# Patient Record
Sex: Male | Born: 1950 | Race: White | Hispanic: No | Marital: Married | Smoking: Never smoker
Health system: Southern US, Community
[De-identification: ages and names within clinical notes are randomized; demographics above are authoritative.]

## PROBLEM LIST (undated history)

## (undated) DIAGNOSIS — N189 Chronic kidney disease, unspecified: Secondary | ICD-10-CM

## (undated) HISTORY — PX: OTHER SURGICAL HISTORY: SHX169

---

## 1999-08-28 ENCOUNTER — Other Ambulatory Visit: Admission: RE | Admit: 1999-08-28 | Discharge: 1999-08-28 | Payer: Self-pay | Admitting: Urology

## 2011-07-01 ENCOUNTER — Other Ambulatory Visit: Payer: Self-pay | Admitting: Urology

## 2011-07-01 ENCOUNTER — Ambulatory Visit (HOSPITAL_BASED_OUTPATIENT_CLINIC_OR_DEPARTMENT_OTHER)
Admission: RE | Admit: 2011-07-01 | Discharge: 2011-07-01 | Disposition: A | Discharge status: Home / Self Care | Payer: BC Managed Care – PPO | Source: Ambulatory Visit | Attending: Urology | Admitting: Urology

## 2011-07-01 DIAGNOSIS — N323 Diverticulum of bladder: Secondary | ICD-10-CM | POA: Insufficient documentation

## 2011-07-01 DIAGNOSIS — Z01812 Encounter for preprocedural laboratory examination: Secondary | ICD-10-CM | POA: Insufficient documentation

## 2011-07-01 DIAGNOSIS — R310 Gross hematuria: Secondary | ICD-10-CM | POA: Insufficient documentation

## 2011-07-07 NOTE — Op Note (Signed)
NAME:  Jacob Dunlap, Jacob Dunlap NO.:  [id number]  MEDICAL RECORD NO.:  [id number]  LOCATION:                                 FACILITY:  PHYSICIAN:  Bertram Millard. Aariv Medlock, M.D.DATE OF BIRTH:  06/26/1951  DATE OF PROCEDURE:  07/01/2011 DATE OF DISCHARGE:                              OPERATIVE REPORT   PREOPERATIVE DIAGNOSIS:  History of bladder diverticulum with recent gross hematuria.  POSTOPERATIVE DIAGNOSIS:  History of bladder diverticulum with recent gross hematuria.  PRINCIPAL PROCEDURE:  Cystoscopy, bladder barbotage, bladder biopsy.  SURGEON:  Bertram Millard. Dereke Neumann, M.D.  ANESTHESIA:  General with LMA.  COMPLICATIONS:  None.  BRIEF HISTORY:  Jacob Dunlap is a 60 year old male, who I had been seeing for approximately 10 to 12 years.  The patient has a bladder diverticulum. He has intermittent gross hematuria.  Biopsies of a little inflammatory area in the diverticulum has revealed only atypia with no evidence of neoplasia.  He recently presented with gross hematuria and urinary retention.  A Foley catheter was placed.  He has had intermittent gross hematuria despite this catheterization.  As the patient has had this recent hematuria, a bladder diverticulum and history of atypia, he presents at this time for anesthetic cystoscopy, bladder biopsy.  Risks and complications of procedure have been discussed with the patient.  He understands these and desires to proceed.  DESCRIPTION OF PROCEDURE:  Jacob Dunlap was identified in the holding area. He received preoperative IV antibiotics.  He was taken to the operating room where general anesthetic was administered using the LMA.  He is placed in the dorsal lithotomy position.  Genitalia and perineum were prepped and draped.  Time-out was performed. The procedure then commenced.  A 22-French panendoscope was passed under direct vision through his urethra.  Urethra was normal, there was no significant prostatic obstruction.   Bladder was then entered.  No specific raised lesions were seen.  There were scattered erythematous areas throughout the bladder, circumferentially.  This looked almost like findings consistent with acute cystitis.  Three of these areas were biopsied with the cold cup biopsy forceps.  The biopsy sites were then cauterized with the Bugbee electrode.  There was only 1 diverticulum noted.  This was posterior, just about in the midline, superior to the trigone.  It had a very wide diverticular neck.  The diverticulum was entered.  It was very capacious, approximately the same volume is the bladder.  There were several "years" of the diverticulum.  There was only one area of abnormality that I could see, which was approximately 1 cm x 2 cm in size.  There were some mildly erythematous tissue.  It did not look to me like carcinoma in situ.  There were several small clots that we irrigated out of the diverticulum.  Once these clots were irrigated, I did not see any other lesions.  The small erythematous area was biopsied x2 with a cold cup biopsy forceps, and the biopsy site was then coagulated.  I obtained washings from both the diverticulum in the bladder through the cystoscope, these were sent for urine cytology.  Following the cytologies being sent off, the biopsied sites were inspected.  No bleeding was seen.  At this point, the bladder was drained and a 20-French Foley catheter was placed and hooked to dependent drainage.  The patient tolerated the procedure well.  He was awakened and taken to PACU in stable condition. It should be noted that 3 specimens were sent - 1 specimen from the bladder biopsy, 1 specimen from the diverticular biopsy, and 1 bladder washing.     Bertram Millard. Retta Diones, M.D.     SMD/MEDQ  D:  07/02/2011  T:  07/02/2011  Job:  [id number]  Electronically Signed by Marcine Matar M.D. on 07/07/2011 01:12:12 PM

## 2011-08-20 ENCOUNTER — Other Ambulatory Visit: Payer: Self-pay | Admitting: Urology

## 2011-09-10 ENCOUNTER — Encounter (HOSPITAL_COMMUNITY): Payer: Self-pay | Admitting: Pharmacy Technician

## 2011-09-18 ENCOUNTER — Encounter (HOSPITAL_COMMUNITY): Payer: Self-pay

## 2011-09-18 ENCOUNTER — Other Ambulatory Visit: Payer: Self-pay

## 2011-09-18 ENCOUNTER — Encounter (HOSPITAL_COMMUNITY)
Admission: RE | Admit: 2011-09-18 | Discharge: 2011-09-18 | Disposition: A | Discharge status: Home / Self Care | Payer: BC Managed Care – PPO | Source: Ambulatory Visit | Attending: Urology | Admitting: Urology

## 2011-09-18 ENCOUNTER — Ambulatory Visit (HOSPITAL_COMMUNITY)
Admission: RE | Admit: 2011-09-18 | Discharge: 2011-09-18 | Disposition: A | Discharge status: Home / Self Care | Payer: BC Managed Care – PPO | Source: Ambulatory Visit | Attending: Urology | Admitting: Urology

## 2011-09-18 DIAGNOSIS — Z01812 Encounter for preprocedural laboratory examination: Secondary | ICD-10-CM | POA: Insufficient documentation

## 2011-09-18 DIAGNOSIS — Z01818 Encounter for other preprocedural examination: Secondary | ICD-10-CM | POA: Insufficient documentation

## 2011-09-18 DIAGNOSIS — Z01810 Encounter for preprocedural cardiovascular examination: Secondary | ICD-10-CM | POA: Insufficient documentation

## 2011-09-18 HISTORY — DX: Chronic kidney disease, unspecified: N18.9

## 2011-09-18 LAB — CBC
MCH: 24.8 pg — ABNORMAL LOW (ref 26.0–34.0)
MCHC: 30.4 g/dL (ref 30.0–36.0)
MCV: 81.8 fL (ref 78.0–100.0)
Platelets: 241 10*3/uL (ref 150–400)
RBC: 4.55 MIL/uL (ref 4.22–5.81)

## 2011-09-18 LAB — BASIC METABOLIC PANEL
BUN: 22 mg/dL (ref 6–23)
CO2: 27 mEq/L (ref 19–32)
Calcium: 9.1 mg/dL (ref 8.4–10.5)
GFR calc non Af Amer: >90 mL/min (ref >90)
Glucose, Bld: 92 mg/dL (ref 70–99)

## 2011-09-18 LAB — SURGICAL PCR SCREEN: MRSA, PCR: NEGATIVE

## 2011-09-18 NOTE — Patient Instructions (Signed)
[address] LENIS NETTLETON  09/18/2011   Your procedure is scheduled on:  09/25/11 [id number] am   Report to [facility] at 0515 AM.  Call this number if you have problems the morning of surgery: [phone]   Remember:   Do not eat food:After Midnight.  May have clear liquids:until Midnight .  Clear liquids include soda, tea, black coffee, apple or grape juice, broth.  Take these medicines the morning of surgery with A SIP OF WATER:    Do not wear jewelry.  Do not wear lotions, powders, or perfumes. .  Do not shave 48 hours prior to surgery.Do not bring valuables to the hospital.  Contacts, dentures or bridgework may not be worn into surgery.  Leave suitcase in the car. After surgery it may be brought to your room.  For patients admitted to the hospital, checkout time is 11:00 AM the day of discharge.     Special Instructions: CHG Shower Use Special Wash: 1/2 bottle night before surgery and 1/2 bottle morning of surgery. shower chin to toes with CHG.  Wash face and private parts with regular soap.     Please read over the following fact sheets that you were given: MRSA Information, coughing and deep breathing exercises and leg exercises.

## 2011-09-24 NOTE — H&P (Signed)
  Chief Complaint  Bladder diverticulum   History of Present Illness  Jacob Dunlap is a 61 year old patient who has been followed by Dr. Retta Diones since 1996.  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 He most recently presented with gross hematuria in October and underwent cystoscopy with biopsies of his bladder and diverticulum which were negative for malignancy. Bladder washings for cytology were also benign and a hematuria protocol CT scan did not indicate any concerning upper tract source. Due to his persistent problems with chronic urinary tract infections and gross hematuria, he is seen at the request of Dr. Retta Diones to consider a robotic bladder diverticulectomy. His baseline renal function is normal with a creatinine of 0.95.  He was recently treated for a citrobacter UTI with nitrofurantoin. He currently has a symptomatically without hematuria or dysuria. However, he does continue to have cloudy urine.   Past Medical History Problems  1. History of  No Medical Problems  Surgical History Problems  1. History of  Cystoscopy With Biopsy  Current Meds 1. Multi-Day TABS; Therapy: (Recorded:26Mar2012) to  Allergies Medication  1. No Known Drug Allergies  Family History Problems  1. Family history of  Family Health Status Number Of Children 2-sons 2. Paternal history of  Reported Previous Cardiac Problems  Social History Problems  1. Paternal history of  Death In The Family Father age 37 Heart trouble 2. Marital History - Currently Married 3. Never A Smoker 4. 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 Alcohol Use 6. Tobacco Use  Review of Systems Constitutional, skin, eye, otolaryngeal, hematologic/lymphatic, cardiovascular, pulmonary, endocrine, musculoskeletal, gastrointestinal, neurological and psychiatric system(s) were reviewed and pertinent findings if present are noted.    Vitals  BMI Calculated: 23.97 BSA Calculated: 1.87 Height: 5 ft 8.5 in Weight: 160 lb    Physical Exam Constitutional: Well nourished and well developed . No acute distress.  ENT:. The ears and nose are normal in appearance.  Neck: The appearance of the neck is normal and no neck mass is present.  Pulmonary: No respiratory distress and normal respiratory rhythm and effort.  Cardiovascular: Heart rate and rhythm are normal . No peripheral edema.  Abdomen: The abdomen is soft and nontender. No masses are palpated. No CVA tenderness. No hernias are palpable. No hepatosplenomegaly noted.  Skin: Normal skin turgor, no visible rash and no visible skin lesions.  Neuro/Psych:. Mood and affect are appropriate.        Assessment Assessed  1. Diverticulum Of The Bladder 596.3  Plan  1. Bladder diverticulum: He will undergo cystoscopy and stent placement followed by a robotic assisted laparoscopic bladder diverticulectomy.

## 2011-09-25 ENCOUNTER — Encounter (HOSPITAL_COMMUNITY): Payer: Self-pay | Admitting: *Deleted

## 2011-09-25 ENCOUNTER — Other Ambulatory Visit: Payer: Self-pay | Admitting: Urology

## 2011-09-25 ENCOUNTER — Encounter (HOSPITAL_COMMUNITY): Payer: Self-pay | Admitting: Registered Nurse

## 2011-09-25 ENCOUNTER — Ambulatory Visit (HOSPITAL_COMMUNITY): Payer: BC Managed Care – PPO | Admitting: Registered Nurse

## 2011-09-25 ENCOUNTER — Ambulatory Visit (HOSPITAL_COMMUNITY): Payer: BC Managed Care – PPO

## 2011-09-25 ENCOUNTER — Encounter (HOSPITAL_COMMUNITY)
Admission: RE | Disposition: A | Discharge status: Home / Self Care | Payer: Self-pay | Source: Ambulatory Visit | Attending: Urology

## 2011-09-25 ENCOUNTER — Observation Stay (HOSPITAL_COMMUNITY)
Admission: RE | Admit: 2011-09-25 | Discharge: 2011-09-26 | DRG: 311 | Disposition: A | Discharge status: Home / Self Care | Payer: BC Managed Care – PPO | Source: Ambulatory Visit | Attending: Urology | Admitting: Urology

## 2011-09-25 DIAGNOSIS — Z87440 Personal history of urinary (tract) infections: Secondary | ICD-10-CM

## 2011-09-25 DIAGNOSIS — N323 Diverticulum of bladder: Principal | ICD-10-CM | POA: Diagnosis present

## 2011-09-25 LAB — BASIC METABOLIC PANEL
BUN: 12 mg/dL (ref 6–23)
GFR calc Af Amer: >90 mL/min (ref >90)
GFR calc non Af Amer: 88 mL/min — ABNORMAL LOW (ref >90)
Potassium: 3.6 mEq/L (ref 3.5–5.1)
Sodium: 136 mEq/L (ref 135–145)

## 2011-09-25 LAB — HEMOGLOBIN AND HEMATOCRIT, BLOOD: Hemoglobin: 10.1 g/dL — ABNORMAL LOW (ref 13.0–17.0)

## 2011-09-25 LAB — TYPE AND SCREEN
ABO/RH(D): A POS
Antibody Screen: NEGATIVE

## 2011-09-25 LAB — ABO/RH: ABO/RH(D): A POS

## 2011-09-25 SURGERY — CYSTOSCOPY, WITH STENT INSERTION
Anesthesia: General | Site: Bladder | Wound class: Clean Contaminated

## 2011-09-25 MED ORDER — ZOLPIDEM TARTRATE 5 MG PO TABS: 5.0000 mg | ORAL_TABLET | Freq: Every evening | ORAL | Status: DC | PRN

## 2011-09-25 MED ORDER — SODIUM CHLORIDE 0.9 % IV SOLN
1.5000 g | INTRAVENOUS | Status: AC
  Administered 2011-09-25: 1.5 g via INTRAVENOUS
  Filled 2011-09-25: qty 1.5

## 2011-09-25 MED ORDER — FENTANYL CITRATE 0.05 MG/ML IJ SOLN
INTRAMUSCULAR | Status: DC | PRN
  Administered 2011-09-25 (×5): 50 ug via INTRAVENOUS

## 2011-09-25 MED ORDER — PROPOFOL 10 MG/ML IV EMUL
INTRAVENOUS | Status: DC | PRN
  Administered 2011-09-25: 150 mg via INTRAVENOUS

## 2011-09-25 MED ORDER — DIPHENHYDRAMINE HCL 12.5 MG/5ML PO ELIX: 12.5000 mg | ORAL_SOLUTION | Freq: Four times a day (QID) | ORAL | Status: DC | PRN

## 2011-09-25 MED ORDER — LIDOCAINE HCL (CARDIAC) 20 MG/ML IV SOLN
INTRAVENOUS | Status: DC | PRN
  Administered 2011-09-25: 80 mg via INTRAVENOUS

## 2011-09-25 MED ORDER — CIPROFLOXACIN IN D5W 400 MG/200ML IV SOLN
400.0000 mg | Freq: Two times a day (BID) | INTRAVENOUS | Status: AC
  Administered 2011-09-25 – 2011-09-26 (×2): 400 mg via INTRAVENOUS
  Filled 2011-09-25 (×3): qty 200

## 2011-09-25 MED ORDER — MORPHINE SULFATE 2 MG/ML IJ SOLN
2.0000 mg | INTRAMUSCULAR | Status: DC | PRN
  Administered 2011-09-25: 2 mg via INTRAVENOUS
  Filled 2011-09-25: qty 1

## 2011-09-25 MED ORDER — PROMETHAZINE HCL 25 MG/ML IJ SOLN: 6.2500 mg | INTRAMUSCULAR | Status: DC | PRN

## 2011-09-25 MED ORDER — HYDROMORPHONE HCL PF 1 MG/ML IJ SOLN
INTRAMUSCULAR | Status: DC | PRN
  Administered 2011-09-25 (×2): 0.5 mg via INTRAVENOUS

## 2011-09-25 MED ORDER — BUPIVACAINE-EPINEPHRINE 0.25% -1:200000 IJ SOLN
INTRAMUSCULAR | Status: DC | PRN
  Administered 2011-09-25: 20 mL

## 2011-09-25 MED ORDER — ONDANSETRON HCL 4 MG/2ML IJ SOLN
INTRAMUSCULAR | Status: DC | PRN
  Administered 2011-09-25: 4 mg via INTRAVENOUS

## 2011-09-25 MED ORDER — LACTATED RINGERS IV SOLN
INTRAVENOUS | Status: DC | PRN
  Administered 2011-09-25: 07:00:00 via INTRAVENOUS

## 2011-09-25 MED ORDER — ACETAMINOPHEN 325 MG PO TABS: 650.0000 mg | ORAL_TABLET | ORAL | Status: DC | PRN

## 2011-09-25 MED ORDER — NEOSTIGMINE METHYLSULFATE 1 MG/ML IJ SOLN
INTRAMUSCULAR | Status: DC | PRN
  Administered 2011-09-25: 4 mg via INTRAVENOUS

## 2011-09-25 MED ORDER — CIPROFLOXACIN IN D5W 400 MG/200ML IV SOLN
400.0000 mg | INTRAVENOUS | Status: AC
  Administered 2011-09-25: 400 mg via INTRAVENOUS

## 2011-09-25 MED ORDER — KCL IN DEXTROSE-NACL 20-5-0.45 MEQ/L-%-% IV SOLN
INTRAVENOUS | Status: DC
  Administered 2011-09-25 – 2011-09-26 (×3): via INTRAVENOUS
  Filled 2011-09-25 (×5): qty 1000

## 2011-09-25 MED ORDER — CIPROFLOXACIN HCL 500 MG PO TABS: 500.0000 mg | ORAL_TABLET | Freq: Two times a day (BID) | ORAL | Status: AC

## 2011-09-25 MED ORDER — SODIUM CHLORIDE 0.9 % IR SOLN
Status: DC | PRN
  Administered 2011-09-25: 1000 mL

## 2011-09-25 MED ORDER — CEFAZOLIN SODIUM 1-5 GM-% IV SOLN
1.0000 g | Freq: Three times a day (TID) | INTRAVENOUS | Status: AC
  Administered 2011-09-25 – 2011-09-26 (×2): 1 g via INTRAVENOUS
  Filled 2011-09-25 (×3): qty 50

## 2011-09-25 MED ORDER — SODIUM CHLORIDE 0.9 % IV BOLUS (SEPSIS)
1000.0000 mL | Freq: Once | INTRAVENOUS | Status: AC
  Administered 2011-09-25: 1000 mL via INTRAVENOUS

## 2011-09-25 MED ORDER — LACTATED RINGERS IV SOLN: INTRAVENOUS | Status: DC

## 2011-09-25 MED ORDER — MIDAZOLAM HCL 5 MG/5ML IJ SOLN
INTRAMUSCULAR | Status: DC | PRN
  Administered 2011-09-25: 2 mg via INTRAVENOUS

## 2011-09-25 MED ORDER — HYDROMORPHONE HCL PF 1 MG/ML IJ SOLN
0.2500 mg | INTRAMUSCULAR | Status: DC | PRN
  Administered 2011-09-25 (×2): 0.5 mg via INTRAVENOUS

## 2011-09-25 MED ORDER — STERILE WATER FOR IRRIGATION IR SOLN
Status: DC | PRN
  Administered 2011-09-25: 1500 mL

## 2011-09-25 MED ORDER — ONDANSETRON HCL 4 MG/2ML IJ SOLN: 4.0000 mg | INTRAMUSCULAR | Status: DC | PRN

## 2011-09-25 MED ORDER — DIPHENHYDRAMINE HCL 50 MG/ML IJ SOLN: 12.5000 mg | Freq: Four times a day (QID) | INTRAMUSCULAR | Status: DC | PRN

## 2011-09-25 MED ORDER — LACTATED RINGERS IR SOLN
Status: DC | PRN
  Administered 2011-09-25: 1000 mL

## 2011-09-25 MED ORDER — ACETAMINOPHEN 10 MG/ML IV SOLN
INTRAVENOUS | Status: DC | PRN
  Administered 2011-09-25: 1000 mg via INTRAVENOUS

## 2011-09-25 MED ORDER — DOCUSATE SODIUM 100 MG PO CAPS
100.0000 mg | ORAL_CAPSULE | Freq: Two times a day (BID) | ORAL | Status: DC
  Administered 2011-09-25 – 2011-09-26 (×2): 100 mg via ORAL
  Filled 2011-09-25 (×3): qty 1

## 2011-09-25 MED ORDER — GLYCOPYRROLATE 0.2 MG/ML IJ SOLN
INTRAMUSCULAR | Status: DC | PRN
  Administered 2011-09-25: .6 mg via INTRAVENOUS

## 2011-09-25 MED ORDER — KETOROLAC TROMETHAMINE 15 MG/ML IJ SOLN
15.0000 mg | Freq: Four times a day (QID) | INTRAMUSCULAR | Status: DC
  Administered 2011-09-25 – 2011-09-26 (×5): 15 mg via INTRAVENOUS
  Filled 2011-09-25 (×5): qty 1

## 2011-09-25 MED ORDER — ROCURONIUM BROMIDE 100 MG/10ML IV SOLN
INTRAVENOUS | Status: DC | PRN
  Administered 2011-09-25: 50 mg via INTRAVENOUS
  Administered 2011-09-25: 5 mg via INTRAVENOUS
  Administered 2011-09-25: 20 mg via INTRAVENOUS
  Administered 2011-09-25: 10 mg via INTRAVENOUS

## 2011-09-25 MED ORDER — HYDROCODONE-ACETAMINOPHEN 5-325 MG PO TABS: 1.0000 | ORAL_TABLET | Freq: Four times a day (QID) | ORAL | Status: AC | PRN

## 2011-09-25 SURGICAL SUPPLY — 80 items
ADAPTER CATH URET PLST 4-6FR (CATHETERS) ×3 IMPLANT
ADAPTER GOLDBERG URETERAL (ADAPTER) IMPLANT
APPLICATOR COTTON TIP 6IN STRL (MISCELLANEOUS) IMPLANT
APPLICATOR SURGIFLO ENDO (HEMOSTASIS) IMPLANT
BAG URO CATCHER STRL LF (DRAPE) ×3 IMPLANT
BLADE SURG SZ10 CARB STEEL (BLADE) ×3 IMPLANT
CANISTER SUCTION 2500CC (MISCELLANEOUS) ×3 IMPLANT
CATH INTERMIT  6FR 70CM (CATHETERS) ×3 IMPLANT
CHLORAPREP W/TINT 26ML (MISCELLANEOUS) ×3 IMPLANT
CLIP LIGATING HEM O LOK PURPLE (MISCELLANEOUS) ×6 IMPLANT
CLOTH BEACON ORANGE TIMEOUT ST (SAFETY) ×3 IMPLANT
CORD HIGH FREQUENCY UNIPOLAR (ELECTROSURGICAL) ×3 IMPLANT
CORDS BIPOLAR (ELECTRODE) ×3 IMPLANT
COVER TIP SHEARS 8 DVNC (MISCELLANEOUS) ×2 IMPLANT
COVER TIP SHEARS 8MM DA VINCI (MISCELLANEOUS) ×1
DECANTER SPIKE VIAL GLASS SM (MISCELLANEOUS) ×3 IMPLANT
DERMABOND ADVANCED (GAUZE/BANDAGES/DRESSINGS) ×1
DERMABOND ADVANCED .7 DNX12 (GAUZE/BANDAGES/DRESSINGS) ×2 IMPLANT
DRAIN CHANNEL 15F RND FF 3/16 (WOUND CARE) ×3 IMPLANT
DRAPE CAMERA CLOSED 9X96 (DRAPES) ×3 IMPLANT
DRSG TEGADERM 6X8 (GAUZE/BANDAGES/DRESSINGS) ×9 IMPLANT
ELECT REM PT RETURN 9FT ADLT (ELECTROSURGICAL) ×3
ELECTRODE REM PT RTRN 9FT ADLT (ELECTROSURGICAL) ×2 IMPLANT
GLOVE BIOGEL M STRL SZ7.5 (GLOVE) ×9 IMPLANT
GOWN STRL NON-REIN LRG LVL3 (GOWN DISPOSABLE) ×15 IMPLANT
GUIDEWIRE STR DUAL SENSOR (WIRE) ×3 IMPLANT
HOLDER FOLEY CATH W/STRAP (MISCELLANEOUS) ×3 IMPLANT
MANIFOLD NEPTUNE II (INSTRUMENTS) ×3 IMPLANT
NDL SAFETY ECLIPSE 18X1.5 (NEEDLE) ×2 IMPLANT
NEEDLE HYPO 18GX1.5 SHARP (NEEDLE) ×1
NS IRRIG 1000ML POUR BTL (IV SOLUTION) ×3 IMPLANT
PACK CYSTO (CUSTOM PROCEDURE TRAY) ×3 IMPLANT
PACK ROBOT UROLOGY CUSTOM (CUSTOM PROCEDURE TRAY) ×3 IMPLANT
POSITIONER SURGICAL ARM (MISCELLANEOUS) ×6 IMPLANT
POUCH ENDO CATCH II 15MM (MISCELLANEOUS) IMPLANT
POUCH SPECIMEN RETRIEVAL 10MM (ENDOMECHANICALS) ×3 IMPLANT
RELOAD GOLD (STAPLE) IMPLANT
RELOAD LINEAR CUT PROX 55 BLUE (ENDOMECHANICALS) IMPLANT
RELOAD WHITE ECR60W (STAPLE) IMPLANT
SET TUBE IRRIG SUCTION NO TIP (IRRIGATION / IRRIGATOR) ×3 IMPLANT
SOLUTION ELECTROLUBE (MISCELLANEOUS) ×3 IMPLANT
SPONGE GAUZE 4X4 12PLY (GAUZE/BANDAGES/DRESSINGS) ×3 IMPLANT
SPONGE LAP 18X18 X RAY DECT (DISPOSABLE) IMPLANT
STAPLE ECHEON FLEX 60 POW ENDO (STAPLE) IMPLANT
STAPLER GUN LINEAR PROX 60 (STAPLE) IMPLANT
STAPLER PROXIMATE 55 BLUE (STAPLE) IMPLANT
STENT CONTOUR 7FRX24X.038 (STENTS) ×6 IMPLANT
STENT SINGLE 7F (STENTS) IMPLANT
SURGIFLO W/THROMBIN 8M KIT (HEMOSTASIS) IMPLANT
SUT MNCRL AB 4-0 PS2 18 (SUTURE) ×6 IMPLANT
SUT MON AB 2-0 SH 27 (SUTURE) IMPLANT
SUT MON AB 2-0 SH27 (SUTURE) IMPLANT
SUT PDS AB 1 CTX 36 (SUTURE) IMPLANT
SUT PDS AB 3-0 SH 27 (SUTURE) IMPLANT
SUT PDS AB 4-0 RB1 27 (SUTURE) IMPLANT
SUT PDS AB 4-0 SH 27 (SUTURE) ×3 IMPLANT
SUT SILK 0 (SUTURE)
SUT SILK 0 30XBRD TIE 6 (SUTURE) IMPLANT
SUT SILK 2 0 (SUTURE)
SUT SILK 2 0 SH CR/8 (SUTURE) IMPLANT
SUT SILK 2-0 30XBRD TIE 12 (SUTURE) IMPLANT
SUT SILK 3 0 (SUTURE)
SUT SILK 3 0 12 30 (SUTURE) IMPLANT
SUT SILK 3-0 18XBRD TIE 12 (SUTURE) IMPLANT
SUT VIC AB 0 CT1 27 (SUTURE)
SUT VIC AB 0 CT1 27XBRD ANTBC (SUTURE) IMPLANT
SUT VIC AB 1 BRD 54 (SUTURE) IMPLANT
SUT VIC AB 2-0 SH 27 (SUTURE)
SUT VIC AB 2-0 SH 27X BRD (SUTURE) IMPLANT
SUT VIC AB 3-0 SH 27 (SUTURE) ×1
SUT VIC AB 3-0 SH 27X BRD (SUTURE) ×2 IMPLANT
SUT VIC AB 4-0 SH 18 (SUTURE) IMPLANT
SUT VICRYL 0 UR6 27IN ABS (SUTURE) ×6 IMPLANT
SYR 3ML LL SCALE MARK (SYRINGE) ×3 IMPLANT
SYSTEM UROSTOMY GENTLE TOUCH (WOUND CARE) IMPLANT
TROCAR XCEL 12X100 BLDLESS (ENDOMECHANICALS) IMPLANT
TUBING CONNECTING 10 (TUBING) ×3 IMPLANT
URINEMETER 200ML W/220 (MISCELLANEOUS) IMPLANT
WATER STERILE IRR 1500ML POUR (IV SOLUTION) ×6 IMPLANT
YANKAUER SUCT BULB TIP 10FT TU (MISCELLANEOUS) ×3 IMPLANT

## 2011-09-25 NOTE — Op Note (Signed)
Preoperative diagnosis: Bladder diverticulum  Postoperative diagnosis: Bladder diverticulum  Procedure:  1. Cystoscopy 2. Right retrograde pyelography with interpretation 3. Bilateral ureteral stent placement ( 6 x 24) 4. Robotic-assisted laparoscopic bladder diverticulectomy  Surgeon: Moody Bruins. M.D.  Assistant: Pecola Leisure, PA-C  Anesthesia: General  Complications: None  EBL: 100 mL  IVF:  2200 mL crystalloid  Specimens: 1. Bladder diverticulum 2. Perivesical tissue  Disposition of specimens: Pathology  Intraoperative findings: Right retrograde pyelography revealed a normal caliber ureter without filling defects. The renal collecting system was also without dilation or filling defects.  Drains: 1. 20 Fr Foley catheter 2. # 19 Blake pelvic drain  Indication: Jacob Dunlap is a 61 y.o. year old patient with a bladder diverticulum.  After a thorough review of the management options for treatment of prostate cancer, he elected to proceed with surgical therapy and the above procedure(s).  We have discussed the potential benefits and risks of the procedure, side effects of the proposed treatment, the likelihood of the patient achieving the goals of the procedure, and any potential problems that might occur during the procedure or recuperation. Informed consent has been obtained.  Description of procedure:  The patient was taken to the operating room and a general anesthetic was administered. He was given preoperative antibiotics, placed in the dorsal lithotomy position, and prepped and draped in the usual sterile fashion. Next a preoperative timeout was performed. Cystourethroscopy was then performed and the bladder was systematically examined. The ureteral orifice these were noted to be in the normal anatomic position and were effluxing clear urine. There was noted to be a large bladder diverticulum just superior to the right ureteral orifice. Cystoscopic  inspection of the diverticulum did reveal a prior biopsy site and some mild erythema without tumor or other abnormalities. There were also no other noted tumors or mucosal abnormalities throughout the remainder of the bladder. Attention then turned to the right ureteral orifice which was cannulated with a 6 French ureteral catheter. Omnipaque contrast was then injected which revealed a normal caliber ureter without filling defects or other abnormalities. A 0.038 cm guidewire was advanced up the right ureter into the renal pelvis under fluoroscopic guidance. A 6 x 24 double-J ureteral stent was then advanced over the wire using Seldinger technique and positioned appropriately under fluoroscopic and cystoscopic guidance. The wire was removed with a good curl noted in the renal pelvis as well as in the bladder. Theureteral catheter was then placed into the bladder diverticulum and contrast was injected and the diverticular neck was noted to be in close proximity to the right ureter but not involving the right ureter. Attention then turned to the left ureteral orifice and a 6 x 24 double-J ureteral stent was placed in a similar fashion to the contralateral side. The bladder was emptied and the patient was then repositioned in the dorsal lithotomy position in steep Trendelenburg. His abdomen was prepped and draped in the usual sterile fashion and a new 20 French Foley catheter was placed under sterile conditions.  A site was selected near the umbilicus for placement of the camera port. This was placed using a standard open Hassan technique which allowed entry into the peritoneal cavity under direct vision and without difficulty. A 12 mm port was placed and a pneumoperitoneum established. The camera was then used to inspect the abdomen and there was no evidence of any intra-abdominal injuries or other abnormalities. The remaining abdominal ports were then placed. 8 mm robotic ports were  placed in the right lower  quadrant, left lower quadrant, and far left lateral abdominal wall. A 5 mm port was placed in the right upper quadrant and a 12 mm port was placed in the right lateral abdominal wall for laparoscopic assistance. All ports were placed under direct vision without difficulty. The surgical cart was then docked.   Utilizing the cautery scissors, the peritoneum was incised over the vicinity of the diverticulum which could be generally identified due to the asymmetric nature of the posterior bladder. The right ureter was carefully identified with the indwelling ureteral stent noted. Dissection then proceeded with a combination of sharp and cautery dissection allowing the diverticulum to eventually be dissected out to a neck in the expected position of the right posterior aspect of the bladder. The bladder was filled with sterile saline to help delineate and fill out the diverticulum. During this dissection, the right ureter was noted to be uninvolved with the diverticulum and it was felt that a ureteral reimplant would not be necessary. The neck of the diverticulum was excised from the bladder and inspection of the bladder revealed the ureteral stent curl well away from the cystotomy. The diverticulum specimen as well as the perivesical tissue were placed in an Endopouch retrieval bag for later removal. The cystotomy incision was then closed in 2 layers with a 3-0 Vicryl suture used to close the mucosal layer of the bladder followed by a 2-0 Vicryl imbricating suture to close the muscular layer of the bladder. The bladder was then filled with 300 cc of sterile saline and the anastomosis appeared to be watertight. The catheter was then placed to straight drainage.   A #19 Blake drain was then brought through the left lateral 8 mm port site and positioned appropriately within the pelvis. It was secured to the skin with a nylon suture. The surgical cart was then undocked. The right lateral 12 mm port site was closed at  the fascial level with a 0 Vicryl suture placed laparoscopically. All remaining ports were then removed under direct vision. The specimen was removed intact within the Endopouch retrieval bag via the periumbilical camera port site. This fascial opening was closed with two running 0 Vicryl sutures. 0.25% Marcaine was then injected into all port sites and all incisions were reapproximated at the skin level with staples. Sterile dressings were applied. The patient appeared to tolerate the procedure well and without complications. The patient was able to be extubated and transferred to the recovery unit in satisfactory condition.  Moody Bruins MD

## 2011-09-25 NOTE — Anesthesia Preprocedure Evaluation (Addendum)
Anesthesia Evaluation  Patient identified by MRN, date of birth, ID band Patient awake    Reviewed: Allergy & Precautions, H&P , NPO status , Patient's Chart, lab work & pertinent test results, reviewed documented beta blocker date and time   Airway Mallampati: II TM Distance: >3 FB Neck ROM: Full    Dental  (+) Teeth Intact   Pulmonary neg pulmonary ROS,  clear to auscultation        Cardiovascular neg cardio ROS Regular Normal denies cardiac symptoms   Neuro/Psych Negative Neurological ROS  Negative Psych ROS   GI/Hepatic negative GI ROS, Neg liver ROS,   Endo/Other  Negative Endocrine ROS  Renal/GU negative Renal ROS   Bladder diverticulum    Musculoskeletal negative musculoskeletal ROS (+)   Abdominal   Peds negative pediatric ROS (+)  Hematology negative hematology ROS (+)   Anesthesia Other Findings   Reproductive/Obstetrics negative OB ROS                           Anesthesia Physical Anesthesia Plan  ASA: I  Anesthesia Plan: General   Post-op Pain Management:    Induction: Intravenous  Airway Management Planned: Oral ETT  Additional Equipment:   Intra-op Plan:   Post-operative Plan: Extubation in OR  Informed Consent: I have reviewed the patients History and Physical, chart, labs and discussed the procedure including the risks, benefits and alternatives for the proposed anesthesia with the patient or authorized representative who has indicated his/her understanding and acceptance.     Plan Discussed with: CRNA and Surgeon  Anesthesia Plan Comments:         Anesthesia Quick Evaluation

## 2011-09-25 NOTE — Progress Notes (Signed)
Pt did mechanical bowel prep 09/24/11 with good results

## 2011-09-25 NOTE — Progress Notes (Signed)
Day of Surgery Subjective: Patient reports pain control good. No N/V  Objective: Vital signs in last 24 hours: Temp:  [96.5 F (35.8 C)-98.8 F (37.1 C)] 97.3 F (36.3 C) (01/17 1504) Pulse Rate:  [65-82] 82  (01/17 1504) Resp:  [10-18] 16  (01/17 1504) BP: (134-151)/(74-94) 142/77 mmHg (01/17 1504) SpO2:  [95 %-100 %] 95 % (01/17 1504) Weight:  [82.3 kg (181 lb 7 oz)] 82.3 kg (181 lb 7 oz) (01/17 1504)  Intake/Output this shift: Total I/O In: 3900 [I.V.:2900; IV Piggyback:1000] Out: 240 [Urine:130; Drains:10; Blood:100]  Physical Exam:  General:alert, cooperative and no distress Cardiovascular: RRR Lungs:  GI: soft Incisions: C/D/I Urine: pink Extremities: SCDs in place  Lab Results:  Basename 09/25/11 1155  HGB 10.1*  HCT 33.1*   BMET  Basename 09/25/11 1155  NA 136  K 3.6  CL 104  CO2 25  GLUCOSE 111*  BUN 12  CREATININE 0.98  CALCIUM 8.3*   No results found for this basename: LABPT:3,INR:3 in the last 72 hours No results found for this basename: LABURIN:1 in the last 72 hours Results for orders placed during the hospital encounter of 09/18/11  SURGICAL PCR SCREEN     Status: Normal   Collection Time   09/18/11 11:18 AM      Component Value Range Status Comment   MRSA, PCR NEGATIVE  NEGATIVE  Final    Staphylococcus aureus NEGATIVE  NEGATIVE  Final     Studies/Results: Dg Retrograde Pyelogram  09/25/2011  *RADIOLOGY REPORT*  Clinical data:   Resection of bladder diverticulum.  INTRAOPERATIVE RIGHT RETROGRADE UROGRAPHY  Comparison:  None.  Technique:  Images were obtained with the C-arm fluoroscopic device intraoperatively and submitted for interpretation post-operatively. Please see the procedural report for the amount of contrast and the fluoroscopy time utilized.  Findings:  Images were obtained intraoperatively.  After cannulation of the right ureter by a cystoscope, images submitted show normal patency of the distal right ureter.  No additional  imaging was submitted.  IMPRESSION: Normal patency of opacified right ureter.  Original Report Authenticated By: Reola Calkins, M.D.    Assessment/Plan: Day of Surgery, Procedure(s) (LRB): CYSTOSCOPY WITH STENT PLACEMENT (Bilateral) ROBOTIC ASSISTED LAPAROSCOPIC BLADDER DIVERTICULECTOMY (N/A)  Doing well Ambulate, Incentive spirometry DVT prophylaxis Clear liquids Will check JP drain Cr in a.m.  LOS: 0 days   YARBROUGH,Alexys Lobello G. 09/25/2011, 3:51 PM

## 2011-09-25 NOTE — Transfer of Care (Signed)
Immediate Anesthesia Transfer of Care Note  Patient: Jacob Dunlap  Procedure(s) Performed:  CYSTOSCOPY WITH STENT PLACEMENT - CYSTOSCOPY, BILATERAL URETERAL STENTS,   (c-arm) ; ROBOTIC ASSISTED LAPAROSCOPIC BLADDER DIVERTICULECTOMY - ROBOTIC ASSISTED LAPAROSCOPIC BLADDER DIVERTICULECTOMY  Patient Location: PACU  Anesthesia Type: General  Level of Consciousness: awake, alert , oriented and patient cooperative  Airway & Oxygen Therapy: Patient Spontanous Breathing and Patient connected to face mask oxygen  Post-op Assessment: Report given to PACU RN, Post -op Vital signs reviewed and stable and Patient moving all extremities  Post vital signs: Reviewed and stable Filed Vitals:   09/25/11 0519  BP: 146/94  Pulse: 80  Temp: 37.1 C  Resp: 18    Complications: No apparent anesthesia complications

## 2011-09-25 NOTE — Preoperative (Signed)
Beta Blockers   Reason not to administer Beta Blockers:Not Applicable 

## 2011-09-25 NOTE — Anesthesia Postprocedure Evaluation (Signed)
  Anesthesia Post-op Note  Patient: Jacob Dunlap  Procedure(s) Performed:  CYSTOSCOPY WITH STENT PLACEMENT - CYSTOSCOPY, BILATERAL URETERAL STENTS,   (c-arm) ; ROBOTIC ASSISTED LAPAROSCOPIC BLADDER DIVERTICULECTOMY - ROBOTIC ASSISTED LAPAROSCOPIC BLADDER DIVERTICULECTOMY  Patient Location: PACU  Anesthesia Type: General  Level of Consciousness: oriented and sedated  Airway and Oxygen Therapy: Patient Spontanous Breathing and Patient connected to nasal cannula oxygen  Post-op Pain: mild  Post-op Assessment: Post-op Vital signs reviewed, Patient's Cardiovascular Status Stable, Respiratory Function Stable, Patent Airway and No signs of Nausea or vomiting  Post-op Vital Signs: stable  Complications: No apparent anesthesia complications

## 2011-09-26 LAB — CREATININE, FLUID (PLEURAL, PERITONEAL, JP DRAINAGE)

## 2011-09-26 LAB — BASIC METABOLIC PANEL
Calcium: 7.9 mg/dL — ABNORMAL LOW (ref 8.4–10.5)
GFR calc non Af Amer: >90 mL/min (ref >90)
Glucose, Bld: 117 mg/dL — ABNORMAL HIGH (ref 70–99)
Sodium: 139 mEq/L (ref 135–145)

## 2011-09-26 MED ORDER — CIPROFLOXACIN HCL 500 MG PO TABS: 500.0000 mg | ORAL_TABLET | Freq: Two times a day (BID) | ORAL | Status: AC

## 2011-09-26 MED ORDER — HYDROCODONE-ACETAMINOPHEN 5-325 MG PO TABS
1.0000 | ORAL_TABLET | Freq: Four times a day (QID) | ORAL | Status: DC | PRN
  Administered 2011-09-26: 1 via ORAL
  Filled 2011-09-26: qty 1

## 2011-09-26 MED ORDER — HYDROCODONE-ACETAMINOPHEN 5-300 MG PO TABS: 1.0000 | ORAL_TABLET | Freq: Four times a day (QID) | ORAL | Status: DC

## 2011-09-26 MED ORDER — BISACODYL 10 MG RE SUPP
10.0000 mg | Freq: Once | RECTAL | Status: AC
  Administered 2011-09-26: 10 mg via RECTAL
  Filled 2011-09-26: qty 1

## 2011-09-26 NOTE — Discharge Summary (Signed)
Date of admission: 09/25/2011  Date of discharge: 09/26/2011  Admission diagnosis: bladder diverticulum with recurrent UTIs  Discharge diagnosis: same  Secondary diagnoses: none  History and Physical: For full details, please see admission history and physical. Briefly, Jacob Dunlap is a 61 y.o. year old patient  who has been followed by Dr. Retta Diones since 1996. 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 He most recently presented with gross hematuria in October and underwent cystoscopy with biopsies of his bladder and diverticulum which were negative for malignancy. Bladder washings for cytology were also benign and a hematuria protocol CT scan did not indicate any concerning upper tract source. Due to his persistent problems with chronic urinary tract infections and gross hematuria, he was seen at the request of Dr. Retta Diones to consider a robotic bladder diverticulectomy. His baseline renal function is normal with a creatinine of 0.95.   Hospital Course: Pt was admitted and taken to the OR on 09/25/11 for cystoscopy, bilat DJ stent placement, and robotic assisted bladder diverticulectomy.  Pt tolerated the procedure well and was hemodynamically stable immediately post op.  He extubated without complication and was transferred to the PACU in stable condition.  His post op course progressed as expected.  He was able to tolerate a regular diet and ambulate without difficulty.  His pain was well controlled with PO medications.  He remained AF with stable vitals signs throughout the post op course.  His JP Cr was WNL.  On POD 1 he was felt stable for D/C home.  Laboratory values:  Basename 09/26/11 0445 09/25/11 1155  HGB 10.1* 10.1*  HCT 33.4* 33.1*    Basename 09/26/11 0445 09/25/11 1155  CREATININE 0.91 0.98    Disposition: Home  Discharge instruction: The patient was instructed to be  ambulatory but told to refrain from heavy lifting, strenuous activity, or driving. He was educated on foley care as well.  Discharge medications:  Medication List  As of 09/26/2011  3:23 PM   START taking these medications         * ciprofloxacin 500 MG tablet   Commonly known as: CIPRO   Take 1 tablet (500 mg total) by mouth 2 (two) times daily. Start day prior to office visit for foley removal      * ciprofloxacin 500 MG tablet   Commonly known as: CIPRO   Take 1 tablet (500 mg total) by mouth 2 (two) times daily.      * HYDROcodone-acetaminophen 5-325 MG per tablet   Commonly known as: NORCO   Take 1-2 tablets by mouth every 6 (six) hours as needed for pain.      * Hydrocodone-Acetaminophen 5-300 MG Tabs   Take 1-2 tablets by mouth every 6 (six) hours.     * Notice: This list has 4 medication(s) that are the same as other medications prescribed for you. Read the directions carefully, and ask your doctor or other care provider to review them with you.       STOP taking these medications         acetaminophen 325 MG tablet      mulitivitamin with minerals Tabs          Where to get your medications    These are the prescriptions that you need to pick up.   You may get these medications from any pharmacy.         ciprofloxacin  500 MG tablet   Hydrocodone-Acetaminophen 5-300 MG Tabs   HYDROcodone-acetaminophen 5-325 MG per tablet            Followup:  Follow-up Information    Follow up with Crecencio Mc, MD. (office will call you)    Contact information:   [address] [place] [facility] [place] [address] [phone]

## 2011-09-26 NOTE — Progress Notes (Signed)
Patient ID: Jacob Dunlap, male   DOB: 08-07-1951, 61 y.o.   MRN: [id number] 1 Day Post-Op Subjective: The patient is doing well.  No nausea or vomiting. Pain is adequately controlled.  Objective: Vital signs in last 24 hours: Temp:  [96.5 F (35.8 C)-98.2 F (36.8 C)] 97.9 F (36.6 C) (01/18 0521) Pulse Rate:  [55-82] 74  (01/18 0521) Resp:  [10-18] 16  (01/18 0521) BP: (120-151)/(70-85) 136/74 mmHg (01/18 0521) SpO2:  [95 %-100 %] 96 % (01/18 0521) Weight:  [82.3 kg (181 lb 7 oz)] 82.3 kg (181 lb 7 oz) (01/17 1504)  Intake/Output from previous day: 01/17 0701 - 01/18 0700 In: 7280 [P.O.:480; I.V.:5300; IV Piggyback:1500] Out: 2765 [Urine:2640; Drains:25; Blood:100] Intake/Output this shift:    Physical Exam:  General: Alert and oriented. CV: RRR Lungs: Clear bilaterally. GI: Soft, Nondistended. Incisions: Dressings intact. Urine: Clear Extremities: Nontender, no erythema, no edema.  Lab Results:  Basename 09/26/11 0445 09/25/11 1155  HGB 10.1* 10.1*  HCT 33.4* 33.1*      Assessment/Plan: POD# 1 s/p robotic bladder diverticulectomy.  1) SL IVF 2) Ambulate, Incentive spirometry 3) Transition to oral pain medication 4) Dulcolax suppository 5) Check JP creatinine 6) Plan for likely discharge later today   Jacob Dunlap. MD   LOS: 1 day   Jacob Dunlap,LES 09/26/2011, 7:33 AM

## 2011-09-29 MED FILL — Heparin Sodium (Porcine) Inj 1000 Unit/ML: INTRAMUSCULAR | Qty: 1 | Status: AC

## 2012-11-20 IMAGING — RF DG RETROGRADE PYELOGRAM
1 series · 8 of 8 positions shown · non-contrast
Comparison: None.

CLINICAL DATA: Resection of bladder diverticulum.

INTRAOPERATIVE RIGHT RETROGRADE UROGRAPHY
TECHNIQUE: Images were obtained with the C-arm fluoroscopic device
intraoperatively and submitted for interpretation post-operatively.
Please see the procedural report for the amount of contrast and the
fluoroscopy time utilized.

[Series 1: run · 8 of 8 slices shown]
[im 1/8]
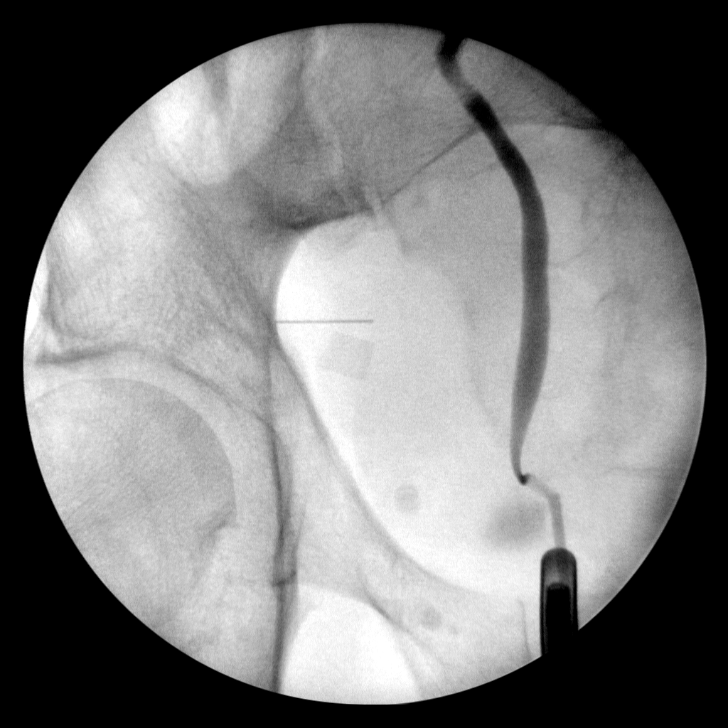
[im 2/8]
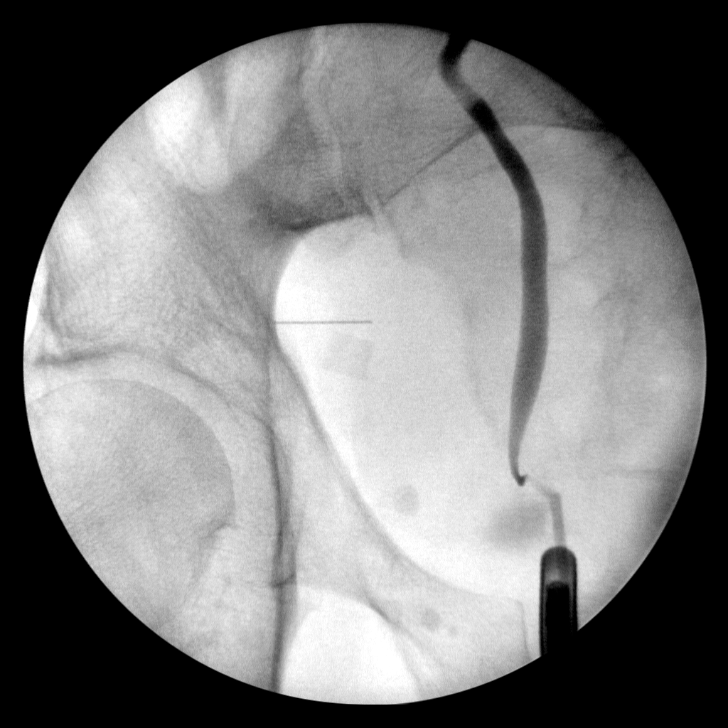
[im 3/8]
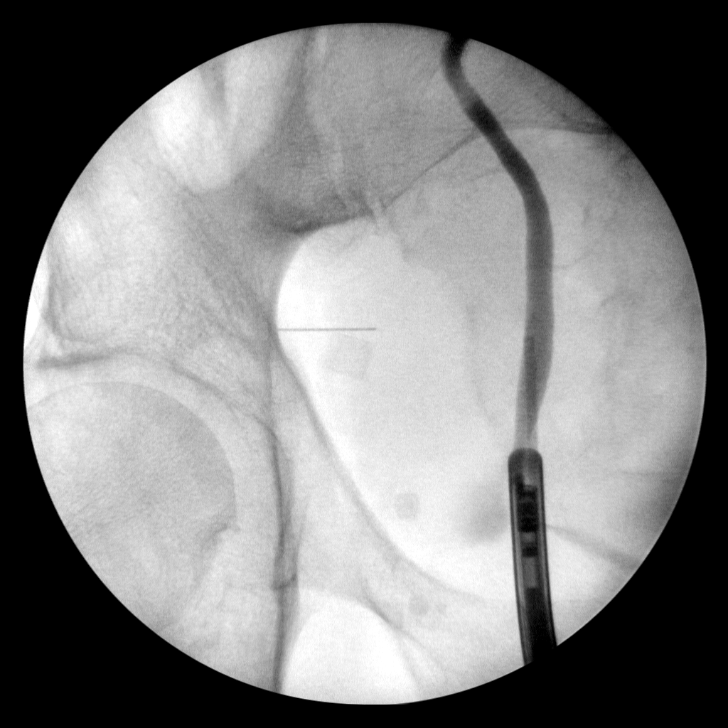
[im 4/8]
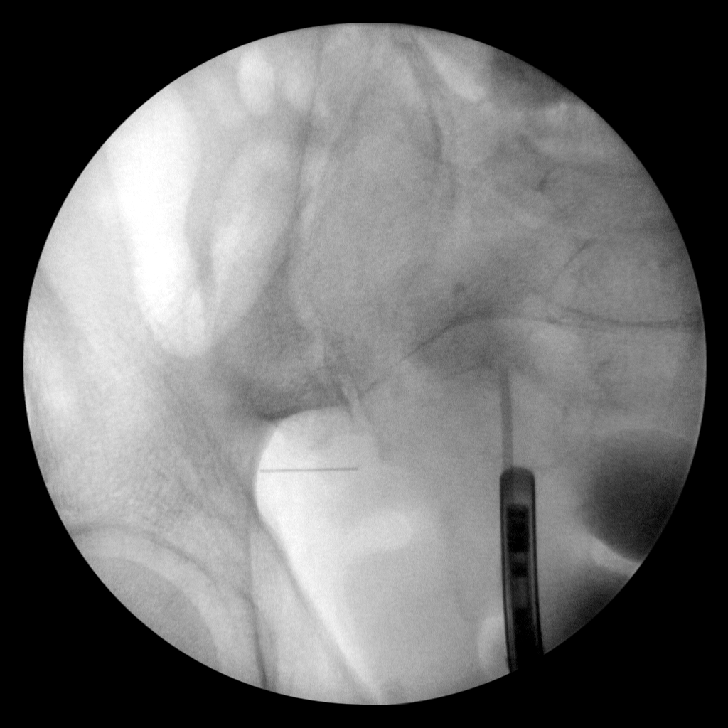
[im 5/8]
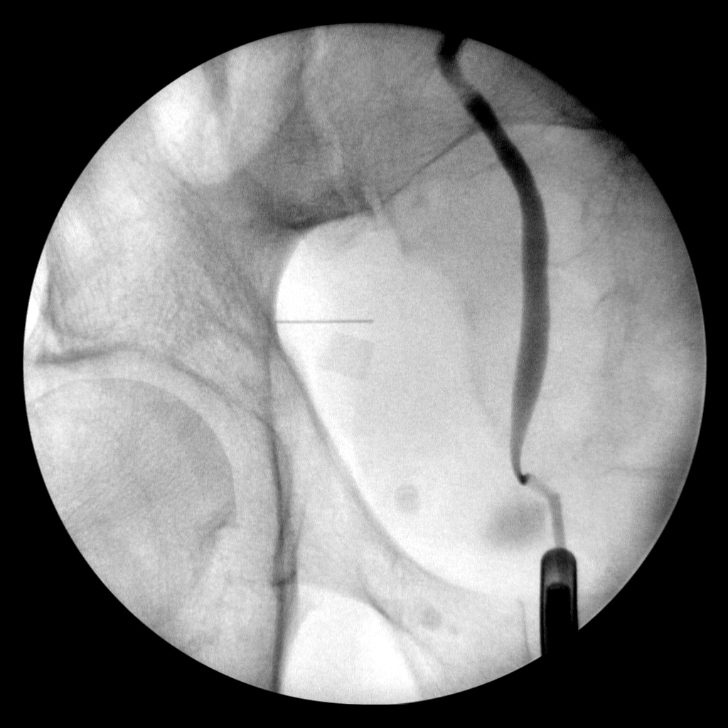
[im 6/8]
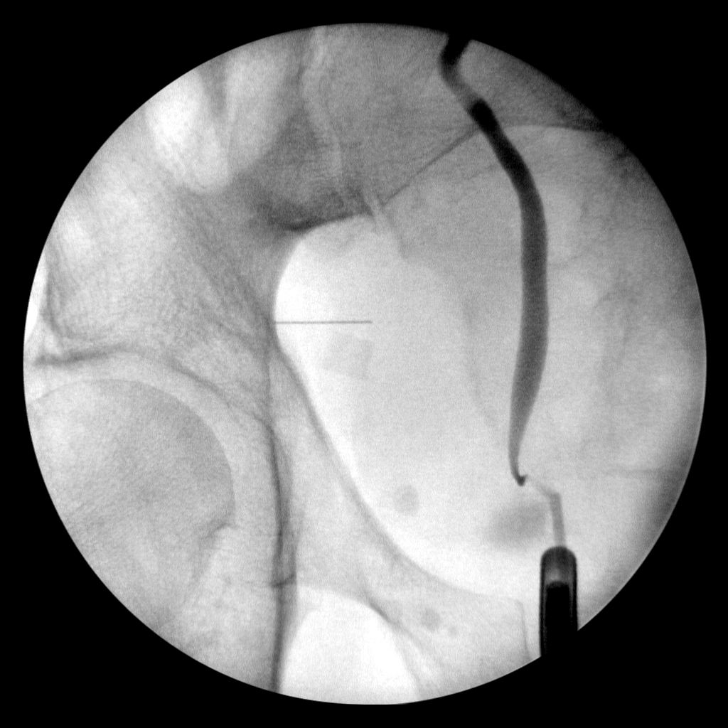
[im 7/8]
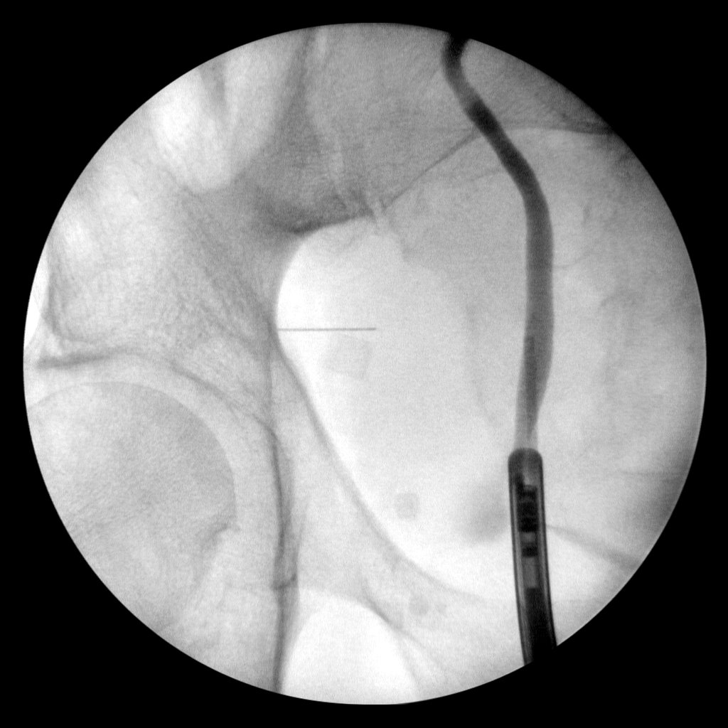
[im 8/8]
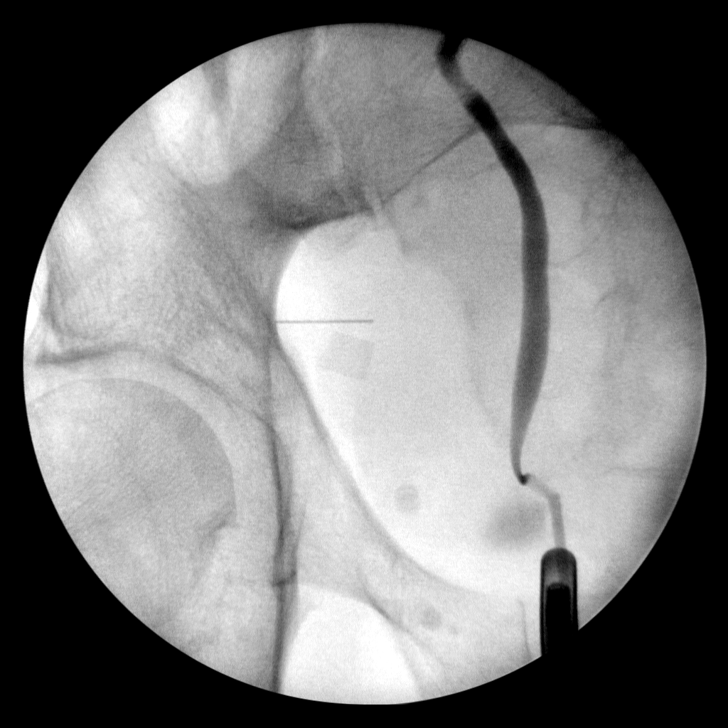

[8 of 8 positions shown; findings below may reference images not displayed]

FINDINGS: Images were obtained intraoperatively.  After
cannulation of the right ureter by a cystoscope, images submitted
show normal patency of the distal right ureter.  No additional
imaging was submitted.
IMPRESSION: Normal patency of opacified right ureter.

## 2016-02-06 ENCOUNTER — Ambulatory Visit
Admission: RE | Admit: 2016-02-06 | Discharge: 2016-02-06 | Disposition: A | Discharge status: Home / Self Care | Payer: BC Managed Care – PPO | Source: Ambulatory Visit | Attending: Internal Medicine | Admitting: Internal Medicine

## 2016-02-06 ENCOUNTER — Other Ambulatory Visit: Payer: Self-pay | Admitting: Internal Medicine

## 2016-02-06 DIAGNOSIS — R059 Cough, unspecified: Secondary | ICD-10-CM

## 2016-02-06 DIAGNOSIS — R05 Cough: Secondary | ICD-10-CM

## 2017-04-03 IMAGING — CR DG CHEST 2V
2 series · 2 of 2 positions shown · non-contrast
Comparison: 09/18/2011

CLINICAL DATA: Cough

EXAM:
CHEST  2 VIEW

[view not recorded (1 of 2)]
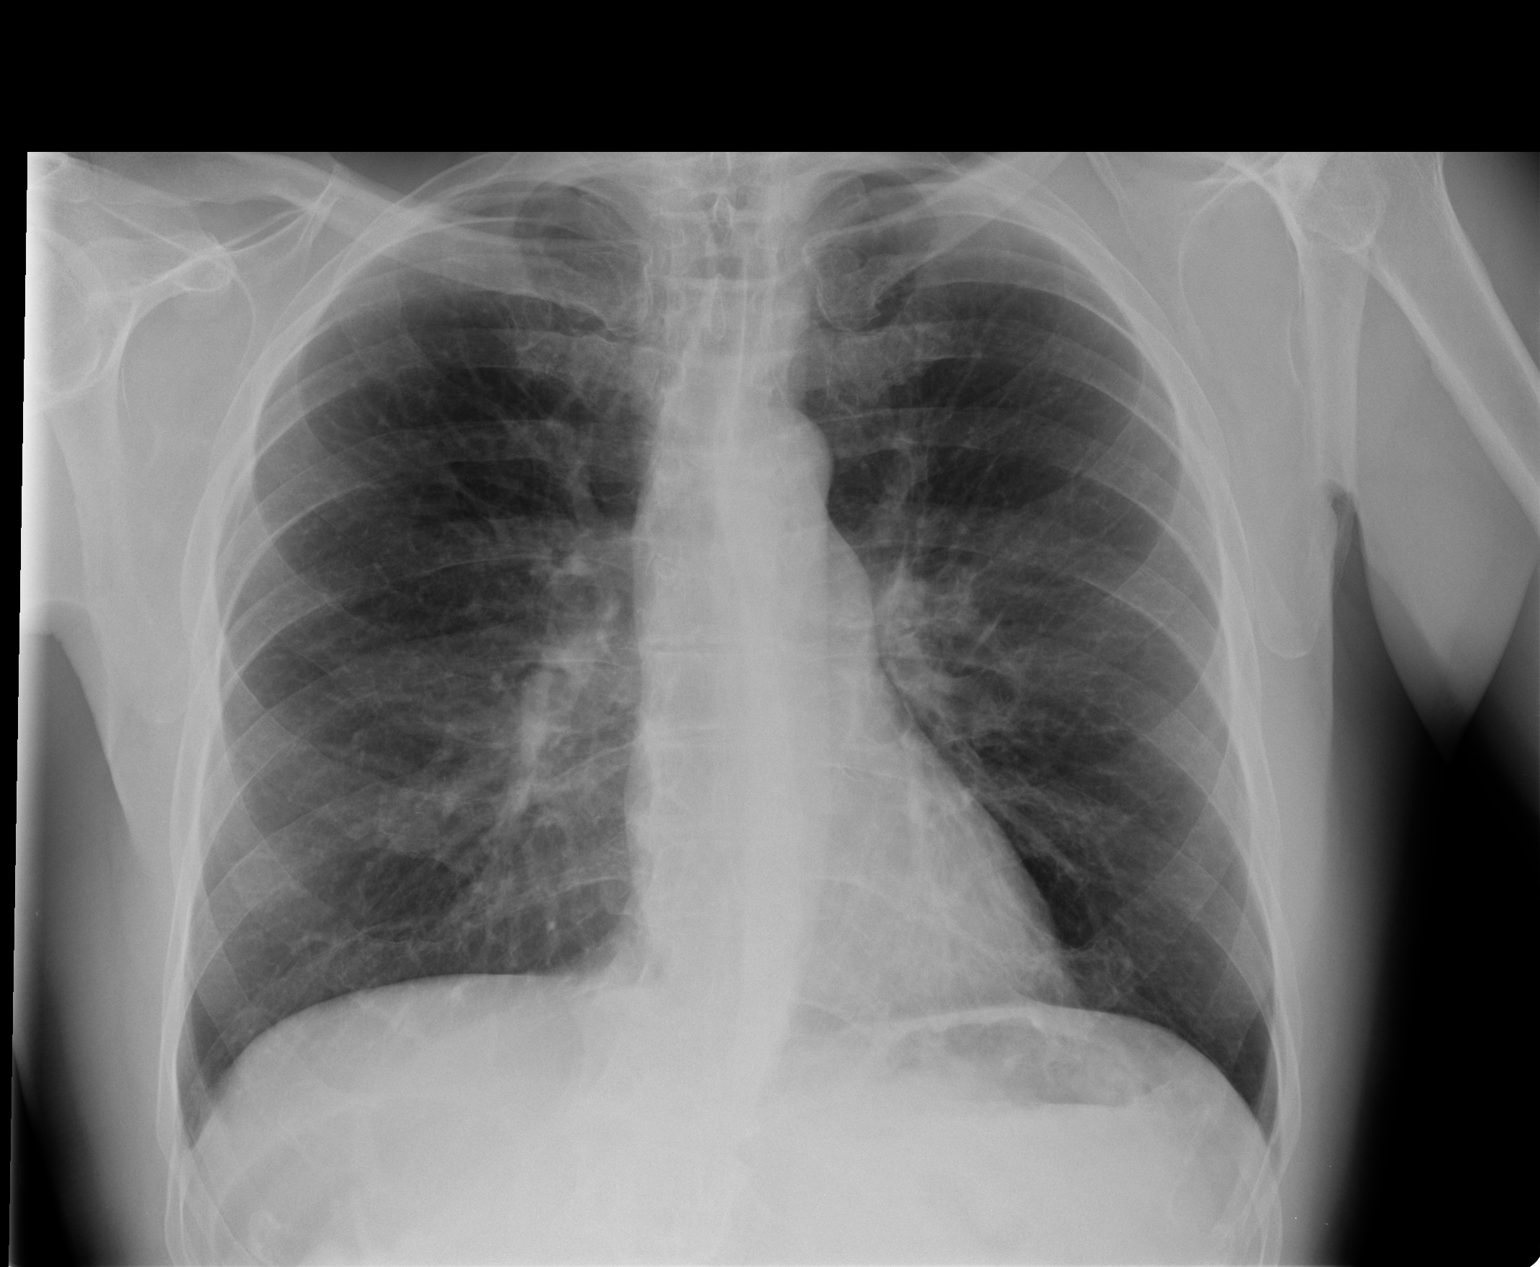

[view not recorded (2 of 2)]
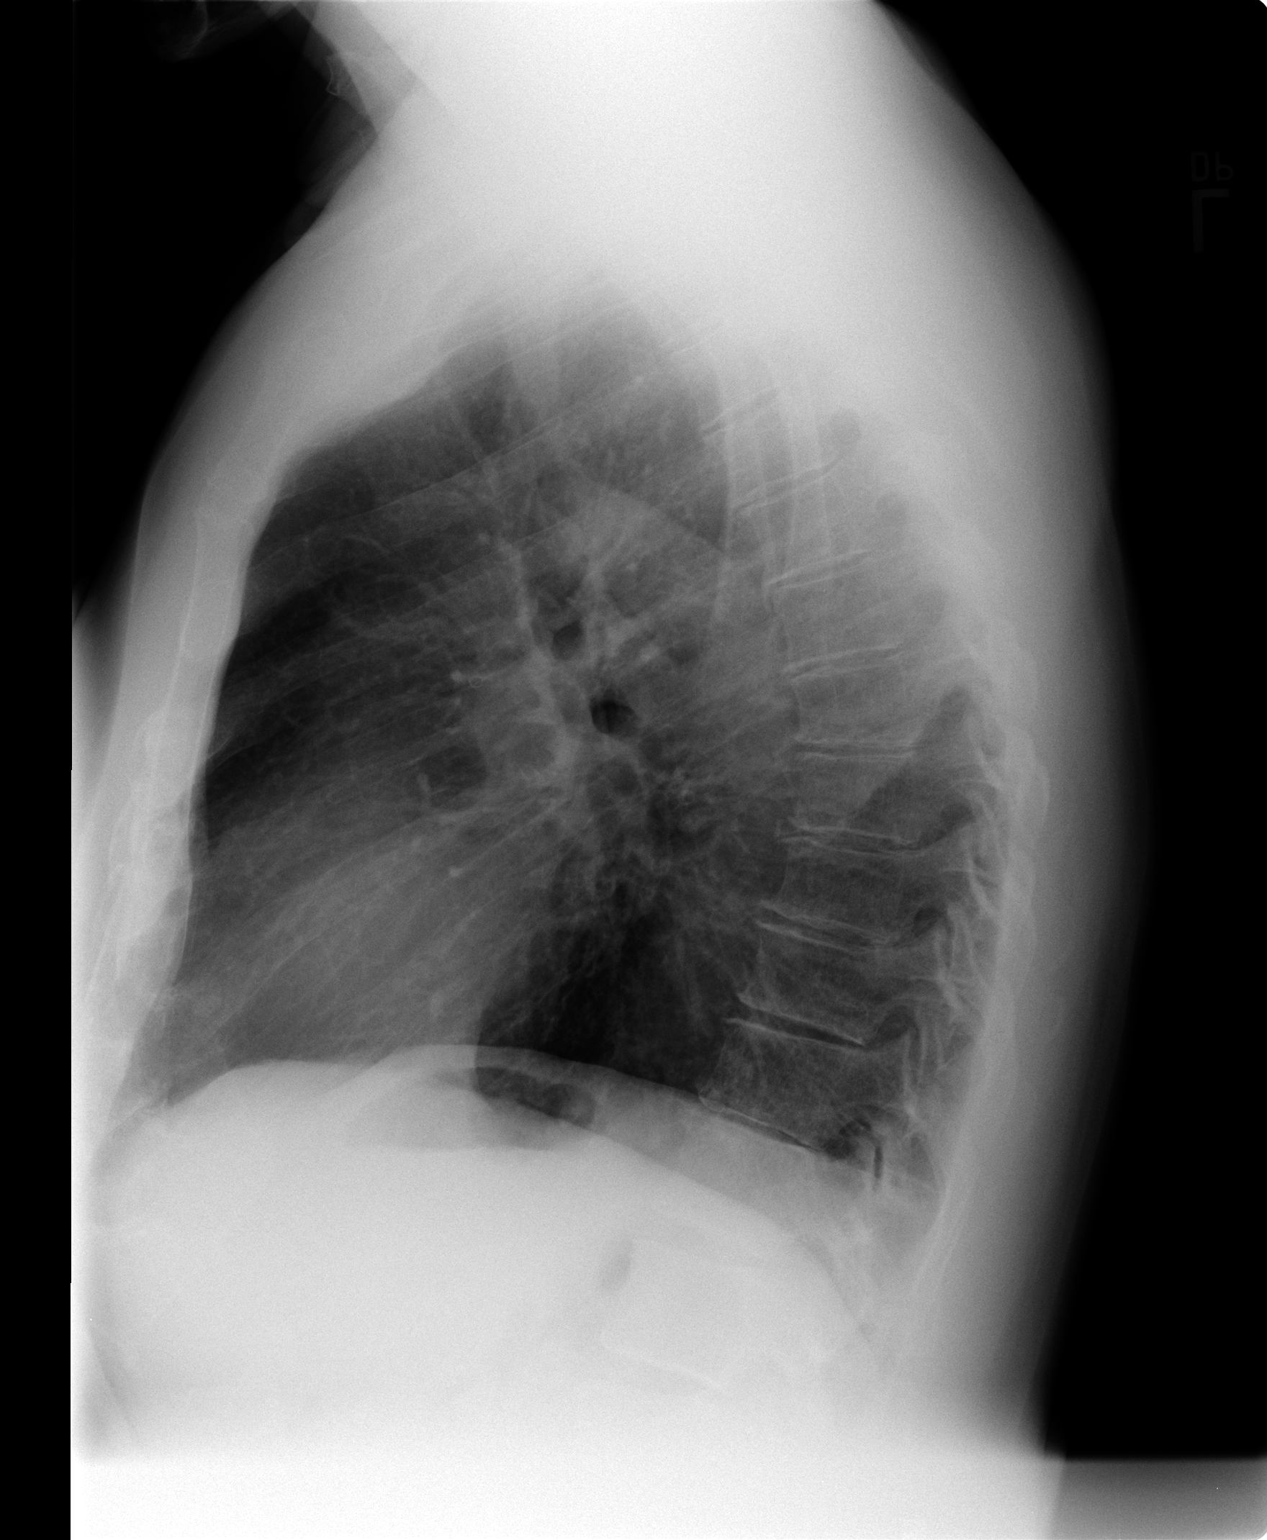

[2 of 2 positions shown; findings below may reference images not displayed]

FINDINGS: Normal heart size. Lungs clear. No pneumothorax. No pleural
effusion.
IMPRESSION: No active cardiopulmonary disease.

## 2019-02-17 ENCOUNTER — Ambulatory Visit (HOSPITAL_BASED_OUTPATIENT_CLINIC_OR_DEPARTMENT_OTHER): Admit: 2019-02-17 | Payer: BC Managed Care – PPO | Admitting: General Surgery

## 2019-02-17 ENCOUNTER — Encounter (HOSPITAL_BASED_OUTPATIENT_CLINIC_OR_DEPARTMENT_OTHER): Payer: Self-pay

## 2019-02-17 SURGERY — REPAIR, HERNIA, INGUINAL, ADULT
Anesthesia: General

## 2019-04-14 ENCOUNTER — Encounter (HOSPITAL_BASED_OUTPATIENT_CLINIC_OR_DEPARTMENT_OTHER): Payer: Self-pay

## 2019-04-14 ENCOUNTER — Other Ambulatory Visit: Payer: Self-pay

## 2019-04-19 ENCOUNTER — Other Ambulatory Visit (HOSPITAL_COMMUNITY)
Admission: RE | Admit: 2019-04-19 | Discharge: 2019-04-19 | Disposition: A | Discharge status: Home / Self Care | Payer: Medicare Other | Source: Ambulatory Visit | Attending: General Surgery | Admitting: General Surgery

## 2019-04-19 DIAGNOSIS — Z20828 Contact with and (suspected) exposure to other viral communicable diseases: Secondary | ICD-10-CM | POA: Diagnosis not present

## 2019-04-19 DIAGNOSIS — Z01812 Encounter for preprocedural laboratory examination: Secondary | ICD-10-CM | POA: Diagnosis not present

## 2019-04-19 LAB — SARS CORONAVIRUS 2 (TAT 6-24 HRS): SARS Coronavirus 2: NEGATIVE

## 2019-04-19 NOTE — Progress Notes (Signed)

## 2019-04-20 ENCOUNTER — Ambulatory Visit: Payer: Self-pay | Admitting: General Surgery

## 2019-04-22 ENCOUNTER — Ambulatory Visit (HOSPITAL_BASED_OUTPATIENT_CLINIC_OR_DEPARTMENT_OTHER): Payer: Medicare Other | Admitting: Certified Registered"

## 2019-04-22 ENCOUNTER — Encounter (HOSPITAL_BASED_OUTPATIENT_CLINIC_OR_DEPARTMENT_OTHER)
Admission: RE | Disposition: A | Discharge status: Home / Self Care | Payer: Self-pay | Source: Home / Self Care | Attending: General Surgery

## 2019-04-22 ENCOUNTER — Encounter (HOSPITAL_BASED_OUTPATIENT_CLINIC_OR_DEPARTMENT_OTHER): Payer: Self-pay | Admitting: *Deleted

## 2019-04-22 ENCOUNTER — Ambulatory Visit (HOSPITAL_BASED_OUTPATIENT_CLINIC_OR_DEPARTMENT_OTHER)
Admission: RE | Admit: 2019-04-22 | Discharge: 2019-04-22 | Disposition: A | Discharge status: Home / Self Care | Payer: Medicare Other | Attending: General Surgery | Admitting: General Surgery

## 2019-04-22 ENCOUNTER — Other Ambulatory Visit: Payer: Self-pay

## 2019-04-22 DIAGNOSIS — K4090 Unilateral inguinal hernia, without obstruction or gangrene, not specified as recurrent: Secondary | ICD-10-CM | POA: Diagnosis present

## 2019-04-22 HISTORY — PX: INGUINAL HERNIA REPAIR: SHX194

## 2019-04-22 SURGERY — REPAIR, HERNIA, INGUINAL, ADULT
Anesthesia: General | Site: Inguinal | Laterality: Left

## 2019-04-22 MED ORDER — ONDANSETRON HCL 4 MG/2ML IJ SOLN
INTRAMUSCULAR | Status: DC | PRN
  Administered 2019-04-22: 4 mg via INTRAVENOUS

## 2019-04-22 MED ORDER — LACTATED RINGERS IV SOLN
INTRAVENOUS | Status: DC
  Administered 2019-04-22 (×2): via INTRAVENOUS

## 2019-04-22 MED ORDER — HYDROMORPHONE HCL 1 MG/ML IJ SOLN: 0.2500 mg | INTRAMUSCULAR | Status: DC | PRN

## 2019-04-22 MED ORDER — ROCURONIUM BROMIDE 10 MG/ML (PF) SYRINGE
PREFILLED_SYRINGE | INTRAVENOUS | Status: AC
  Filled 2019-04-22: qty 20

## 2019-04-22 MED ORDER — CELECOXIB 200 MG PO CAPS
ORAL_CAPSULE | ORAL | Status: AC
  Filled 2019-04-22: qty 1

## 2019-04-22 MED ORDER — BUPIVACAINE-EPINEPHRINE 0.25% -1:200000 IJ SOLN
INTRAMUSCULAR | Status: AC
  Filled 2019-04-22: qty 1

## 2019-04-22 MED ORDER — BUPIVACAINE-EPINEPHRINE (PF) 0.5% -1:200000 IJ SOLN
INTRAMUSCULAR | Status: DC | PRN
  Administered 2019-04-22: 20 mL

## 2019-04-22 MED ORDER — ACETAMINOPHEN 500 MG PO TABS
1000.0000 mg | ORAL_TABLET | ORAL | Status: AC
  Administered 2019-04-22: 1000 mg via ORAL

## 2019-04-22 MED ORDER — BUPIVACAINE-EPINEPHRINE 0.25% -1:200000 IJ SOLN
INTRAMUSCULAR | Status: DC | PRN
  Administered 2019-04-22: 20 mL

## 2019-04-22 MED ORDER — GABAPENTIN 300 MG PO CAPS
300.0000 mg | ORAL_CAPSULE | ORAL | Status: AC
  Administered 2019-04-22: 300 mg via ORAL

## 2019-04-22 MED ORDER — BUPIVACAINE LIPOSOME 1.3 % IJ SUSP
INTRAMUSCULAR | Status: DC | PRN
  Administered 2019-04-22: 10 mL

## 2019-04-22 MED ORDER — HYDROCODONE-ACETAMINOPHEN 5-325 MG PO TABS: 1.0000 | ORAL_TABLET | Freq: Four times a day (QID) | ORAL | 0 refills | Status: DC | PRN

## 2019-04-22 MED ORDER — SUCCINYLCHOLINE CHLORIDE 20 MG/ML IJ SOLN
INTRAMUSCULAR | Status: DC | PRN
  Administered 2019-04-22: 100 mg via INTRAVENOUS

## 2019-04-22 MED ORDER — CHLORHEXIDINE GLUCONATE CLOTH 2 % EX PADS: 6.0000 | MEDICATED_PAD | Freq: Once | CUTANEOUS | Status: DC

## 2019-04-22 MED ORDER — SUCCINYLCHOLINE CHLORIDE 200 MG/10ML IV SOSY
PREFILLED_SYRINGE | INTRAVENOUS | Status: AC
  Filled 2019-04-22: qty 30

## 2019-04-22 MED ORDER — EPHEDRINE 5 MG/ML INJ
INTRAVENOUS | Status: AC
  Filled 2019-04-22: qty 20

## 2019-04-22 MED ORDER — DEXAMETHASONE SODIUM PHOSPHATE 4 MG/ML IJ SOLN: INTRAMUSCULAR | Status: DC | PRN

## 2019-04-22 MED ORDER — FENTANYL CITRATE (PF) 100 MCG/2ML IJ SOLN
INTRAMUSCULAR | Status: AC
  Filled 2019-04-22: qty 2

## 2019-04-22 MED ORDER — MIDAZOLAM HCL 2 MG/2ML IJ SOLN
INTRAMUSCULAR | Status: AC
  Filled 2019-04-22: qty 2

## 2019-04-22 MED ORDER — PROPOFOL 10 MG/ML IV BOLUS
INTRAVENOUS | Status: DC | PRN
  Administered 2019-04-22: 150 mg via INTRAVENOUS

## 2019-04-22 MED ORDER — EPHEDRINE SULFATE 50 MG/ML IJ SOLN
INTRAMUSCULAR | Status: DC | PRN
  Administered 2019-04-22: 5 mg via INTRAVENOUS
  Administered 2019-04-22: 10 mg via INTRAVENOUS

## 2019-04-22 MED ORDER — MIDAZOLAM HCL 2 MG/2ML IJ SOLN
1.0000 mg | INTRAMUSCULAR | Status: DC | PRN
  Administered 2019-04-22: 11:00:00 2 mg via INTRAVENOUS

## 2019-04-22 MED ORDER — ACETAMINOPHEN 500 MG PO TABS
ORAL_TABLET | ORAL | Status: AC
  Filled 2019-04-22: qty 2

## 2019-04-22 MED ORDER — PHENYLEPHRINE 40 MCG/ML (10ML) SYRINGE FOR IV PUSH (FOR BLOOD PRESSURE SUPPORT)
PREFILLED_SYRINGE | INTRAVENOUS | Status: AC
  Filled 2019-04-22: qty 20

## 2019-04-22 MED ORDER — FENTANYL CITRATE (PF) 100 MCG/2ML IJ SOLN
50.0000 ug | INTRAMUSCULAR | Status: DC | PRN
  Administered 2019-04-22: 100 ug via INTRAVENOUS
  Administered 2019-04-22: 25 ug via INTRAVENOUS

## 2019-04-22 MED ORDER — SUGAMMADEX SODIUM 200 MG/2ML IV SOLN
INTRAVENOUS | Status: DC | PRN
  Administered 2019-04-22: 175 mg via INTRAVENOUS

## 2019-04-22 MED ORDER — GABAPENTIN 300 MG PO CAPS
ORAL_CAPSULE | ORAL | Status: AC
  Filled 2019-04-22: qty 1

## 2019-04-22 MED ORDER — DEXAMETHASONE SODIUM PHOSPHATE 10 MG/ML IJ SOLN
INTRAMUSCULAR | Status: DC | PRN
  Administered 2019-04-22: 10 mg via INTRAVENOUS

## 2019-04-22 MED ORDER — CEFAZOLIN SODIUM-DEXTROSE 2-4 GM/100ML-% IV SOLN
2.0000 g | INTRAVENOUS | Status: AC
  Administered 2019-04-22: 2 g via INTRAVENOUS

## 2019-04-22 MED ORDER — CELECOXIB 200 MG PO CAPS
200.0000 mg | ORAL_CAPSULE | ORAL | Status: AC
  Administered 2019-04-22: 200 mg via ORAL

## 2019-04-22 MED ORDER — ROCURONIUM BROMIDE 100 MG/10ML IV SOLN
INTRAVENOUS | Status: DC | PRN
  Administered 2019-04-22: 30 mg via INTRAVENOUS

## 2019-04-22 MED ORDER — CEFAZOLIN SODIUM-DEXTROSE 2-4 GM/100ML-% IV SOLN
INTRAVENOUS | Status: AC
  Filled 2019-04-22: qty 100

## 2019-04-22 SURGICAL SUPPLY — 50 items
BENZOIN TINCTURE PRP APPL 2/3 (GAUZE/BANDAGES/DRESSINGS) IMPLANT
BLADE CLIPPER SURG (BLADE) IMPLANT
BLADE HEX COATED 2.75 (ELECTRODE) ×3 IMPLANT
BLADE SURG 15 STRL LF DISP TIS (BLADE) ×1 IMPLANT
BLADE SURG 15 STRL SS (BLADE) ×2
CHLORAPREP W/TINT 26 (MISCELLANEOUS) ×3 IMPLANT
CLOSURE WOUND 1/2 X4 (GAUZE/BANDAGES/DRESSINGS)
COVER BACK TABLE REUSABLE LG (DRAPES) ×3 IMPLANT
COVER MAYO STAND REUSABLE (DRAPES) ×3 IMPLANT
COVER WAND RF STERILE (DRAPES) IMPLANT
DECANTER SPIKE VIAL GLASS SM (MISCELLANEOUS) IMPLANT
DERMABOND ADVANCED (GAUZE/BANDAGES/DRESSINGS) ×2
DERMABOND ADVANCED .7 DNX12 (GAUZE/BANDAGES/DRESSINGS) ×1 IMPLANT
DRAIN PENROSE 1/2X12 LTX STRL (WOUND CARE) ×3 IMPLANT
DRAPE LAPAROTOMY TRNSV 102X78 (DRAPES) ×3 IMPLANT
DRAPE UTILITY XL STRL (DRAPES) ×3 IMPLANT
ELECT REM PT RETURN 9FT ADLT (ELECTROSURGICAL) ×3
ELECTRODE REM PT RTRN 9FT ADLT (ELECTROSURGICAL) ×1 IMPLANT
GLOVE BIO SURGEON STRL SZ7.5 (GLOVE) ×3 IMPLANT
GLOVE BIOGEL M 6.5 STRL (GLOVE) ×2 IMPLANT
GLOVE BIOGEL PI IND STRL 6.5 (GLOVE) IMPLANT
GLOVE BIOGEL PI INDICATOR 6.5 (GLOVE) ×2
GOWN STRL REUS W/ TWL LRG LVL3 (GOWN DISPOSABLE) ×2 IMPLANT
GOWN STRL REUS W/TWL LRG LVL3 (GOWN DISPOSABLE) ×4
MESH ULTRAPRO 3X6 7.6X15CM (Mesh General) ×2 IMPLANT
NDL HYPO 25X1 1.5 SAFETY (NEEDLE) ×1 IMPLANT
NEEDLE HYPO 25X1 1.5 SAFETY (NEEDLE) ×3 IMPLANT
NS IRRIG 1000ML POUR BTL (IV SOLUTION) ×3 IMPLANT
PACK BASIN DAY SURGERY FS (CUSTOM PROCEDURE TRAY) ×3 IMPLANT
PENCIL BUTTON HOLSTER BLD 10FT (ELECTRODE) ×3 IMPLANT
SLEEVE SCD COMPRESS KNEE MED (MISCELLANEOUS) ×3 IMPLANT
SPONGE LAP 18X18 RF (DISPOSABLE) ×3 IMPLANT
STRIP CLOSURE SKIN 1/2X4 (GAUZE/BANDAGES/DRESSINGS) IMPLANT
SUT MON AB 4-0 PC3 18 (SUTURE) ×3 IMPLANT
SUT PROLENE 2 0 SH DA (SUTURE) ×6 IMPLANT
SUT SILK 2 0 SH (SUTURE) ×2 IMPLANT
SUT SILK 3 0 SH 30 (SUTURE) IMPLANT
SUT SILK 3 0 TIES 17X18 (SUTURE) ×2
SUT SILK 3-0 18XBRD TIE BLK (SUTURE) ×1 IMPLANT
SUT VIC AB 0 CT1 27 (SUTURE)
SUT VIC AB 0 CT1 27XBRD ANBCTR (SUTURE) IMPLANT
SUT VIC AB 0 SH 27 (SUTURE) IMPLANT
SUT VIC AB 2-0 SH 27 (SUTURE) ×2
SUT VIC AB 2-0 SH 27XBRD (SUTURE) ×1 IMPLANT
SUT VIC AB 3-0 SH 27 (SUTURE) ×2
SUT VIC AB 3-0 SH 27X BRD (SUTURE) ×1 IMPLANT
SYR CONTROL 10ML LL (SYRINGE) ×3 IMPLANT
TOWEL GREEN STERILE FF (TOWEL DISPOSABLE) ×6 IMPLANT
TUBING SUCTION 1/4X6FT (MISCELLANEOUS) ×2 IMPLANT
YANKAUER SUCT BULB TIP NO VENT (SUCTIONS) ×2 IMPLANT

## 2019-04-22 NOTE — Anesthesia Postprocedure Evaluation (Signed)
Anesthesia Post Note  Patient: Jacob Dunlap  Procedure(s) Performed: LEFT INGUINAL HERNIA REPAIR WITH MESH (Left Inguinal)     Patient location during evaluation: PACU Anesthesia Type: General and Regional Level of consciousness: awake and alert Pain management: pain level controlled Vital Signs Assessment: post-procedure vital signs reviewed and stable Respiratory status: spontaneous breathing, nonlabored ventilation and respiratory function stable Cardiovascular status: blood pressure returned to baseline and stable Postop Assessment: no apparent nausea or vomiting Anesthetic complications: no    Last Vitals:  Vitals:   04/22/19 1330 04/22/19 1345  BP: 135/74   Pulse: 80 76  Resp: 15 (!) 22  Temp:    SpO2: 100% 95%    Last Pain:  Vitals:   04/22/19 1345  TempSrc:   PainSc: 0-No pain                 Makenzee Choudhry,W. EDMOND

## 2019-04-22 NOTE — Op Note (Signed)
04/22/2019  12:56 PM  PATIENT:  Jacob Dunlap  68 y.o. male  PRE-OPERATIVE DIAGNOSIS:  LEFT INGUINAL HERNIA  POST-OPERATIVE DIAGNOSIS:  LEFT INGUINAL HERNIA  PROCEDURE:  Procedure(s): LEFT INGUINAL HERNIA REPAIR WITH MESH (Left)  SURGEON:  Surgeon(s) and Role:    * Jovita Kussmaul, MD - Primary  PHYSICIAN ASSISTANT:   ASSISTANTS: none   ANESTHESIA:   local and general  EBL:  minimal   BLOOD ADMINISTERED:none  DRAINS: none   LOCAL MEDICATIONS USED:  MARCAINE     SPECIMEN:  No Specimen  DISPOSITION OF SPECIMEN:  N/A  COUNTS:  YES  TOURNIQUET:  * No tourniquets in log *  DICTATION: .Dragon Dictation   After informed consent was obtained the patient was brought to the operating room and placed in the supine position on the operating table.  After adequate induction of general anesthesia the patient's abdomen and left groin were prepped with ChloraPrep, allowed to dry, and draped in usual sterile manner.  An appropriate timeout was performed.  The left groin was then infiltrated with quarter percent Marcaine.  A small incision was made from the edge of the pubic tubercle towards the anterior superior iliac spine.  The incision was carried through the skin and subcutaneous tissue sharply with the electrocautery until the fascia of the external oblique was encountered.  A small bridging vein was clamped with hemostats, divided, and ligated with 3-0 silk ties.  The external oblique fascia was opened along its fibers towards the apex of the external ring with a 15 blade knife and Metzenbaum scissors.  A Wheatland retractor was deployed.  Blunt dissection was then carried out of the cord structures until they could be surrounded between 2 fingers.  1/2 inch Penrose drain was placed around the cord structures for retraction purposes.  During the dissection the ilioinguinal nerve was identified and involved with some scar tissue.  It was dissected proximally distally, clamped, divided,  and ligated with 3-0 silk ties.  The cord structures were then gently skeletonized by blunt hemostat dissection.  I was able to identify a moderate sized hernia sac associated with the cord.  It was carefully dissected away from the rest of the cord structures.  Care was taken to preserve the vas deferens and testicular artery.  The hernia sac was then opened and there were no visceral contents within the sac.  The sac was ligated near its base with a 2-0 silk suture ligature.  The distal sac was resected.  The stump of the sac was allowed to retract back beneath the transversalis muscle.  Next a 3 x 6 piece of ultra Pro mesh was chosen and cut to the appropriate size.  The mesh was sewed inferiorly to the shelving edge of the inguinal ligament with a running 2-0 Prolene stitch.  Tails were cut in the mesh laterally and the tails were wrapped around the cord structures.  The tails of the mesh were anchored to the shelving edge of the inguinal ligament lateral to the cord with an interrupted 2-0 Prolene stitch.  The mesh was then sewed superiorly to the musculoaponeurotic strength layer of the transversalis with interrupted 2-0 Prolene vertical mattress stitches.  Once this was accomplished the mesh seem to be in good position the hernia seem well repaired.  The wound was irrigated with copious amounts of saline.  The external oblique fascia was then reapproximated with a running 2-0 Vicryl stitch.  The wound was infiltrated with more quarter percent Marcaine.  The subcutaneous fascia was closed with a running 3-0 Vicryl stitch.  The skin was then closed with a running 4-0 Monocryl subcuticular stitch.  Dermabond dressings were applied.  The patient tolerated the procedure well.  At the end of the case all needle sponge and instrument counts were correct.  The patient was then awakened and taken recovery in stable condition.  PLAN OF CARE: Discharge to home after PACU  PATIENT DISPOSITION:  PACU - hemodynamically  stable.   Delay start of Pharmacological VTE agent (>24hrs) due to surgical blood loss or risk of bleeding: not applicable

## 2019-04-22 NOTE — Transfer of Care (Signed)
Immediate Anesthesia Transfer of Care Note  Patient: Jacob Dunlap  Procedure(s) Performed: LEFT INGUINAL HERNIA REPAIR WITH MESH (Left Inguinal)  Patient Location: PACU  Anesthesia Type:General  Level of Consciousness: drowsy  Airway & Oxygen Therapy: Patient Spontanous Breathing and Patient connected to face mask oxygen  Post-op Assessment: Report given to RN  Post vital signs: Reviewed  Last Vitals:  Vitals Value Taken Time  BP 139/76 04/22/19 1315  Temp    Pulse 79 04/22/19 1315  Resp 15 04/22/19 1315  SpO2 100 % 04/22/19 1315  Vitals shown include unvalidated device data.  Last Pain:  Vitals:   04/22/19 0959  TempSrc: Oral  PainSc: 0-No pain         Complications: No apparent anesthesia complications

## 2019-04-22 NOTE — Anesthesia Preprocedure Evaluation (Addendum)
Anesthesia Evaluation  Patient identified by MRN, date of birth, ID band Patient awake    Reviewed: Allergy & Precautions, H&P , NPO status , Patient's Chart, lab work & pertinent test results  Airway Mallampati: II  TM Distance: >3 FB Neck ROM: Full    Dental no notable dental hx. (+) Teeth Intact, Dental Advisory Given   Pulmonary neg pulmonary ROS,    Pulmonary exam normal breath sounds clear to auscultation       Cardiovascular negative cardio ROS   Rhythm:Regular Rate:Normal     Neuro/Psych negative neurological ROS  negative psych ROS   GI/Hepatic negative GI ROS, Neg liver ROS,   Endo/Other  negative endocrine ROS  Renal/GU negative Renal ROS  negative genitourinary   Musculoskeletal   Abdominal   Peds  Hematology negative hematology ROS (+)   Anesthesia Other Findings   Reproductive/Obstetrics negative OB ROS                            Anesthesia Physical Anesthesia Plan  ASA: I  Anesthesia Plan: General   Post-op Pain Management:  Regional for Post-op pain   Induction: Intravenous  PONV Risk Score and Plan: 3 and Ondansetron, Dexamethasone and Midazolam  Airway Management Planned: Oral ETT  Additional Equipment:   Intra-op Plan:   Post-operative Plan: Extubation in OR  Informed Consent: I have reviewed the patients History and Physical, chart, labs and discussed the procedure including the risks, benefits and alternatives for the proposed anesthesia with the patient or authorized representative who has indicated his/her understanding and acceptance.     Dental advisory given  Plan Discussed with: CRNA  Anesthesia Plan Comments:         Anesthesia Quick Evaluation

## 2019-04-22 NOTE — Anesthesia Procedure Notes (Signed)
Anesthesia Regional Block: TAP block   Pre-Anesthetic Checklist: ,, timeout performed, Correct Patient, Correct Site, Correct Laterality, Correct Procedure, Correct Position, site marked, Risks and benefits discussed, pre-op evaluation,  At surgeon's request and post-op pain management  Laterality: Left  Prep: Maximum Sterile Barrier Precautions used, chloraprep       Needles:  Injection technique: Single-shot  Needle Type: Echogenic Stimulator Needle     Needle Length: 9cm  Needle Gauge: 21     Additional Needles:   Narrative:  Start time: 04/22/2019 11:00 AM End time: 04/22/2019 11:10 AM Injection made incrementally with aspirations every 5 mL. Anesthesiologist: Roderic Palau, MD  Additional Notes: 2% Lidocaine skin wheel.

## 2019-04-22 NOTE — Interval H&P Note (Signed)
History and Physical Interval Note:  04/22/2019 11:13 AM  Jacob Dunlap  has presented today for surgery, with the diagnosis of LEFT INGUINAL HERNIA.  The various methods of treatment have been discussed with the patient and family. After consideration of risks, benefits and other options for treatment, the patient has consented to  Procedure(s): LEFT INGUINAL HERNIA REPAIR WITH MESH (Left) as a surgical intervention.  The patient's history has been reviewed, patient examined, no change in status, stable for surgery.  I have reviewed the patient's chart and labs.  Questions were answered to the patient's satisfaction.     [name]

## 2019-04-22 NOTE — Anesthesia Procedure Notes (Addendum)
Procedure Name: Intubation Date/Time: 04/22/2019 11:42 AM Performed by: Lavonia Dana, CRNA Pre-anesthesia Checklist: Patient identified, Emergency Drugs available, Suction available and Patient being monitored Patient Re-evaluated:Patient Re-evaluated prior to induction Oxygen Delivery Method: Circle system utilized Preoxygenation: Pre-oxygenation with 100% oxygen Induction Type: IV induction Ventilation: Mask ventilation without difficulty Laryngoscope Size: Mac and 3 Grade View: Grade I Tube type: Oral Tube size: 7.0 mm Number of attempts: 1 Airway Equipment and Method: Stylet and Oral airway Placement Confirmation: ETT inserted through vocal cords under direct vision,  positive ETCO2 and breath sounds checked- equal and bilateral Tube secured with: Tape Dental Injury: Teeth and Oropharynx as per pre-operative assessment

## 2019-04-22 NOTE — H&P (Signed)
Jacob Dunlap  [facility] Patient #: [id number] DOB: 21-Mar-1951 Married / Language: English / Race: White Male   History of Present Illness  The patient is a 68 year old male who presents with an inguinal hernia. The patient is a 68 year old white male who recently began feeling like he had a left inguinal hernia. He reports swelling in the left groin for the last 3-4 years. It seems to be worse when he straining and it seems to go away at night. He has had some discomfort associated with it. He denies any nausea or vomiting. He denies any fevers or chills. He does not smoke. He is otherwise in good health.   Diagnostic Studies History  Colonoscopy  >10 years ago  Allergies  No Known Drug Allergies   Medication History  No Current Medications Medications Reconciled  Social History  Caffeine use  Carbonated beverages, Coffee, Tea. No alcohol use  No drug use  Tobacco use  Never smoker.  Family History  First Degree Relatives  No pertinent family history   Other Problems  Bladder Problems     Review of Systems  General Not Present- Appetite Loss, Chills, Fatigue, Fever, Night Sweats, Weight Gain and Weight Loss. Skin Not Present- Change in Wart/Mole, Dryness, Hives, Jaundice, New Lesions, Non-Healing Wounds, Rash and Ulcer. HEENT Not Present- Earache, Hearing Loss, Hoarseness, Nose Bleed, Oral Ulcers, Ringing in the Ears, Seasonal Allergies, Sinus Pain, Sore Throat, Visual Disturbances, Wears glasses/contact lenses and Yellow Eyes. Respiratory Not Present- Bloody sputum, Chronic Cough, Difficulty Breathing, Snoring and Wheezing. Breast Not Present- Breast Mass, Breast Pain, Nipple Discharge and Skin Changes. Cardiovascular Not Present- Chest Pain, Difficulty Breathing Lying Down, Leg Cramps, Palpitations, Rapid Heart Rate, Shortness of Breath and Swelling of Extremities. Gastrointestinal Not Present- Abdominal Pain, Bloating,  Bloody Stool, Change in Bowel Habits, Chronic diarrhea, Constipation, Difficulty Swallowing, Excessive gas, Gets full quickly at meals, Hemorrhoids, Indigestion, Nausea, Rectal Pain and Vomiting. Male Genitourinary Not Present- Blood in Urine, Change in Urinary Stream, Frequency, Impotence, Nocturia, Painful Urination, Urgency and Urine Leakage. Musculoskeletal Not Present- Back Pain, Joint Pain, Joint Stiffness, Muscle Pain, Muscle Weakness and Swelling of Extremities. Neurological Not Present- Decreased Memory, Fainting, Headaches, Numbness, Seizures, Tingling, Tremor, Trouble walking and Weakness. Psychiatric Not Present- Anxiety, Bipolar, Change in Sleep Pattern, Depression, Fearful and Frequent crying. Endocrine Not Present- Cold Intolerance, Excessive Hunger, Hair Changes, Heat Intolerance, Hot flashes and New Diabetes. Hematology Not Present- Blood Thinners, Easy Bruising, Excessive bleeding, Gland problems, HIV and Persistent Infections.  Vitals Weight: 176.5 lb Height: 69in Body Surface Area: 1.96 m Body Mass Index: 26.06 kg/m  Temp.: 97.57F (Oral)  Pulse: 99 (Regular)  BP: 134/80(Sitting, Left Arm, Standard)       Physical Exam  General Mental Status-Alert. General Appearance-Consistent with stated age. Hydration-Well hydrated. Voice-Normal.  Head and Neck Head-normocephalic, atraumatic with no lesions or palpable masses. Trachea-midline. Thyroid Gland Characteristics - normal size and consistency.  Eye Eyeball - Bilateral-Extraocular movements intact. Sclera/Conjunctiva - Bilateral-No scleral icterus.  Chest and Lung Exam Chest and lung exam reveals -quiet, even and easy respiratory effort with no use of accessory muscles and on auscultation, normal breath sounds, no adventitious sounds and normal vocal resonance. Inspection Chest Wall - Normal. Back - normal.  Cardiovascular Cardiovascular examination reveals -normal heart  sounds, regular rate and rhythm with no murmurs and normal pedal pulses bilaterally.  Abdomen Inspection Inspection of the abdomen reveals - No Hernias. Skin - Scar - no surgical scars. Palpation/Percussion  Palpation and Percussion of the abdomen reveal - Soft, Non Tender, No Rebound tenderness, No Rigidity (guarding) and No hepatosplenomegaly. Auscultation Auscultation of the abdomen reveals - Bowel sounds normal.  Male Genitourinary Note: There is a moderate size bulge in the left groin that reduces with palpation. There is no palpable bulge or impulse with straining in the right groin.   Neurologic Neurologic evaluation reveals -alert and oriented x 3 with no impairment of recent or remote memory. Mental Status-Normal.  Musculoskeletal Normal Exam - Left-Upper Extremity Strength Normal and Lower Extremity Strength Normal. Normal Exam - Right-Upper Extremity Strength Normal and Lower Extremity Strength Normal.  Lymphatic Head & Neck  General Head & Neck Lymphatics: Bilateral - Description - Normal. Axillary  General Axillary Region: Bilateral - Description - Normal. Tenderness - Non Tender. Femoral & Inguinal  Generalized Femoral & Inguinal Lymphatics: Bilateral - Description - Normal. Tenderness - Non Tender.    Assessment & Plan  INGUINAL HERNIA OF LEFT SIDE WITHOUT OBSTRUCTION OR GANGRENE (K40.90) Impression: The patient appears to have a moderate-sized symptomatic left inguinal hernia that reduces. Because of the risk of incarceration and strangulation and think he would benefit from having this fixed. He would also like to have this done. I have discussed with him in detail the risks and benefits of the operation to fix the hernia as well as some of the technical aspects including the use of mesh and the risk of chronic pain and he understands and wishes to proceed. I will plan for a left inguinal hernia repair with mesh  Current Plans Pt Education - Inguinal  Hernia: discussed with patient and provided information.

## 2019-04-22 NOTE — Discharge Instructions (Signed)
No Tylenol or Ibuprofen until 4:30pm if needed   Post Anesthesia Home Care Instructions  Activity: Get plenty of rest for the remainder of the day. A responsible individual must stay with you for 24 hours following the procedure.  For the next 24 hours, DO NOT: -Drive a car -Paediatric nurse -Drink alcoholic beverages -Take any medication unless instructed by your physician -Make any legal decisions or sign important papers.  Meals: Start with liquid foods such as gelatin or soup. Progress to regular foods as tolerated. Avoid greasy, spicy, heavy foods. If nausea and/or vomiting occur, drink only clear liquids until the nausea and/or vomiting subsides. Call your physician if vomiting continues.  Special Instructions/Symptoms: Your throat may feel dry or sore from the anesthesia or the breathing tube placed in your throat during surgery. If this causes discomfort, gargle with warm salt water. The discomfort should disappear within 24 hours.  If you had a scopolamine patch placed behind your ear for the management of post- operative nausea and/or vomiting:  1. The medication in the patch is effective for 72 hours, after which it should be removed.  Wrap patch in a tissue and discard in the trash. Wash hands thoroughly with soap and water. 2. You may remove the patch earlier than 72 hours if you experience unpleasant side effects which may include dry mouth, dizziness or visual disturbances. 3. Avoid touching the patch. Wash your hands with soap and water after contact with the patch.    Information for Discharge Teaching: EXPAREL (bupivacaine liposome injectable suspension)   Your surgeon or anesthesiologist gave you EXPAREL(bupivacaine) to help control your pain after surgery.   EXPAREL is a local anesthetic that provides pain relief by numbing the tissue around the surgical site.  EXPAREL is designed to release pain medication over time and can control pain for up to 72  hours.  Depending on how you respond to EXPAREL, you may require less pain medication during your recovery.  Possible side effects:  Temporary loss of sensation or ability to move in the area where bupivacaine was injected.  Nausea, vomiting, constipation  Rarely, numbness and tingling in your mouth or lips, lightheadedness, or anxiety may occur.  Call your doctor right away if you think you may be experiencing any of these sensations, or if you have other questions regarding possible side effects.  Follow all other discharge instructions given to you by your surgeon or nurse. Eat a healthy diet and drink plenty of water or other fluids.  If you return to the hospital for any reason within 96 hours following the administration of EXPAREL, it is important for health care providers to know that you have received this anesthetic. A teal colored band has been placed on your arm with the date, time and amount of EXPAREL you have received in order to alert and inform your health care providers. Please leave this armband in place for the full 96 hours following administration, and then you may remove the band.

## 2019-04-22 NOTE — Progress Notes (Signed)
Assisted Dr. Edmond Fitzgerald with left, ultrasound guided, transabdominal plane block. Side rails up, monitors on throughout procedure. See vital signs in flow sheet. Tolerated Procedure well. °

## 2019-04-25 ENCOUNTER — Encounter (HOSPITAL_BASED_OUTPATIENT_CLINIC_OR_DEPARTMENT_OTHER): Payer: Self-pay | Admitting: General Surgery

## 2020-10-29 ENCOUNTER — Encounter: Payer: Self-pay | Admitting: Dermatology

## 2020-10-29 ENCOUNTER — Ambulatory Visit (INDEPENDENT_AMBULATORY_CARE_PROVIDER_SITE_OTHER): Payer: Medicare Other | Admitting: Dermatology

## 2020-10-29 ENCOUNTER — Other Ambulatory Visit: Payer: Self-pay

## 2020-10-29 DIAGNOSIS — L821 Other seborrheic keratosis: Secondary | ICD-10-CM

## 2020-10-29 DIAGNOSIS — Z1283 Encounter for screening for malignant neoplasm of skin: Secondary | ICD-10-CM

## 2020-10-29 DIAGNOSIS — L570 Actinic keratosis: Secondary | ICD-10-CM

## 2020-11-05 ENCOUNTER — Encounter: Payer: Self-pay | Admitting: Dermatology

## 2020-11-05 NOTE — Progress Notes (Signed)
   New Patient   Subjective  Jacob Dunlap is a 70 y.o. male who presents for the following: Skin Problem (Patient here today for dark spot right flank x 3-4 years. Per patient it hasn't grown any and no bleeding his wife wants it checked.).  General skin check, wife noticed new brown spot on right lower back Location:  Duration:  Quality:  Associated Signs/Symptoms: Modifying Factors:  Severity:  Timing: Context:    The following portions of the chart were reviewed this encounter and updated as appropriate:  Tobacco  Allergies  Meds  Problems  Med Hx  Surg Hx  Fam Hx      Objective  Well appearing patient in no apparent distress; mood and affect are within normal limits. Objective  Left Forearm - Posterior, Right Elbow - Posterior, Right Malar Cheek: Pink 3 to 5 mm hornlike crusts  Objective  Head - Anterior (Face): Waist up exam today no signs of atypical moles, melanoma or non mole skin cancer.  Objective  Right Flank: Manson Passey subtly textured flattopped 4 mm papule   All skin waist up examined.   Assessment & Plan  AK (actinic keratosis) (3) Right Elbow - Posterior; Left Forearm - Posterior; Right Malar Cheek  Destruction of lesion - Left Forearm - Posterior, Right Elbow - Posterior, Right Malar Cheek Complexity: simple   Destruction method: cryotherapy   Informed consent: discussed and consent obtained   Timeout:  patient name, date of birth, surgical site, and procedure verified Lesion destroyed using liquid nitrogen: Yes   Cryotherapy cycles:  5 Outcome: patient tolerated procedure well with no complications   Post-procedure details: wound care instructions given    Skin exam for malignant neoplasm Head - Anterior (Face)  Yearly skin check  Seborrheic keratosis Right Flank  Wife will check his back twice annually; return if clinical change in any spots.

## 2020-11-09 NOTE — Progress Notes (Signed)
I, Lonn Im, MD, have reviewed all documentation for this visit. The documentation on 11/09/20 for the exam, diagnosis, procedures, and orders are all accurate and complete. 

## 2021-03-13 ENCOUNTER — Encounter: Payer: Self-pay | Admitting: Dermatology

## 2021-03-13 ENCOUNTER — Other Ambulatory Visit: Payer: Self-pay

## 2021-03-13 ENCOUNTER — Telehealth: Payer: Self-pay | Admitting: Dermatology

## 2021-03-13 ENCOUNTER — Ambulatory Visit (INDEPENDENT_AMBULATORY_CARE_PROVIDER_SITE_OTHER): Payer: Medicare Other | Admitting: Dermatology

## 2021-03-13 DIAGNOSIS — R21 Rash and other nonspecific skin eruption: Secondary | ICD-10-CM

## 2021-03-13 NOTE — Telephone Encounter (Signed)
Patient forgot what ST told him to use OTC at his visit today and at the time we were unable to speak to ST while patient was still in the office so he would like for nurse to call him back with that info when we can see what was recommended.

## 2021-03-13 NOTE — Patient Instructions (Addendum)
Examination shows completely normal feet including toe webs and toenails with no sign of fungus.  There is no active fungus in the groin but there is some micropapular subtle dermatitis and erythema on the scrotal sac.  I do not think this is currently an infection.  Jacob Dunlap will initially get over-the-counter triple paste AF and apply this daily to areas prone to itch after bathing for 3 to 4 weeks.  If there is improvement he will taper the use of this or use it only on an as-needed basis.  If there is no improvement he will look for any over-the-counter topical lotion which contains the ingredient pramoxine (Goldbond anti-itch, CeraVe itch relief or other).  This can be applied daily after bathing and as frequently as needed if there is itch relief.  I have asked Jacob Dunlap to please call with a status update in 1 to 2 months.

## 2021-03-14 NOTE — Telephone Encounter (Signed)
Left message for patient to return our phone call.

## 2021-03-19 NOTE — Telephone Encounter (Signed)
Everything on check out sheet lmtc if he has any further questions

## 2021-03-28 ENCOUNTER — Encounter: Payer: Self-pay | Admitting: Dermatology

## 2021-03-28 NOTE — Progress Notes (Signed)
   Follow-Up Visit   Subjective  Jacob Dunlap is a 70 y.o. male who presents for the following: Rash (Groin  2 months- almost gone now- + itch tx- Vaseline- helps some).  Itchy rash on groin for several months Location:  Duration:  Quality:  Associated Signs/Symptoms: Modifying Factors: Vaseline seems to have helped Severity:  Timing: Context:   Objective  Well appearing patient in no apparent distress; mood and affect are within normal limits. Pubic Jacob Dunlap in room with patient     A focused examination was performed including groin, feet.. Relevant physical exam findings are noted in the Assessment and Plan.   Assessment & Plan    Rash Pubic   Examination shows completely normal feet including toe webs and toenails with no sign of fungus.  There is no active fungus in the groin but there is some micropapular subtle dermatitis and erythema on the scrotal sac.  I do not think this is currently an infection.  Jacob Dunlap will initially get over-the-counter triple paste AF and apply this daily to areas prone to itch after bathing for 3 to 4 weeks.  If there is improvement he will taper the use of this or use it only on an as-needed basis.  If there is no improvement he will look for any over-the-counter topical lotion which contains the ingredient pramoxine (Goldbond anti-itch, CeraVe itch relief or other).  This can be applied daily after bathing and as frequently as needed if there is itch relief.  I have asked Jacob Dunlap to please call with a status update in 1 to 2 months.   I, Jacob Harder, MD, have reviewed all documentation for this visit.  The documentation on 03/28/21 for the exam, diagnosis, procedures, and orders are all accurate and complete.

## 2023-01-26 ENCOUNTER — Encounter: Payer: Self-pay | Admitting: Emergency Medicine

## 2023-01-26 ENCOUNTER — Ambulatory Visit
Admission: EM | Admit: 2023-01-26 | Discharge: 2023-01-26 | Disposition: A | Discharge status: Home / Self Care | Payer: Medicare PPO | Attending: Urgent Care | Admitting: Urgent Care

## 2023-01-26 DIAGNOSIS — H6123 Impacted cerumen, bilateral: Secondary | ICD-10-CM | POA: Diagnosis not present

## 2023-01-26 NOTE — Discharge Instructions (Signed)
Recommend occasional use of earwax removal solution, Debrox.  Follow up here or with your primary care provider if your symptoms are worsening or not improving.

## 2023-01-26 NOTE — ED Triage Notes (Signed)
Pt reports left ear fullness x 2 week. States has tried alcohol and OTC medication with no relief.

## 2023-01-26 NOTE — ED Provider Notes (Signed)
Jacob Dunlap    CSN: [id number] Arrival date & time: 01/26/23  2130      History   Chief Complaint Chief Complaint  Patient presents with   Ear Fullness    HPI Jacob Dunlap is a 72 y.o. male.    Ear Fullness    Patient presents to urgent care with complaint of stopped up L ear x 2 weeks.  He endorses right ear sometimes gets clogged as well.  Uses Q-tips though he is aware it is not recommended.   Past Medical History:  Diagnosis Date   Chronic kidney disease    hematuria, bladder diverticulum, no recent problems    There are no problems to display for this patient.   Past Surgical History:  Procedure Laterality Date   INGUINAL HERNIA REPAIR Left 04/22/2019   Procedure: LEFT INGUINAL HERNIA REPAIR WITH MESH;  Surgeon: Griselda Miner, MD;  [facility]:  [facility];  Service: General;  Laterality: Left;   OTHER SURGICAL HISTORY     bladder biopsy        Home Medications    Prior to Admission medications   Medication Sig Start Date End Date Taking? Authorizing Provider  Multiple Vitamin (MULTIVITAMIN) tablet Take 1 tablet by mouth daily.    [provider]    Family History No family history on file.  Social History Social History   Tobacco Use   Smoking status: Never   Smokeless tobacco: Never  Vaping Use   Vaping Use: Never used  Substance Use Topics   Alcohol use: Never    Comment: rare beer   Drug use: No     Allergies   Patient has no known allergies.   Review of Systems Review of Systems   Physical Exam Triage Vital Signs ED Triage Vitals [01/26/23 0942]  Enc Vitals Group     BP 139/88     Pulse Rate 81     Resp 18     Temp 98.2 F (36.8 C)     Temp Source Oral     SpO2 92 %     Weight      Height      Head Circumference      Peak Flow      Pain Score      Pain Loc      Pain Edu?      Excl. in GC?    No data found.  Updated Vital Signs BP 139/88 (BP Location: Left Arm)   Pulse  81   Temp 98.2 F (36.8 C) (Oral)   Resp 18   SpO2 92%   Visual Acuity Right Eye Distance:   Left Eye Distance:   Bilateral Distance:    Right Eye Near:   Left Eye Near:    Bilateral Near:     Physical Exam Vitals reviewed.  Constitutional:      Appearance: Normal appearance.  HENT:     Right Ear: There is impacted cerumen.     Left Ear: There is impacted cerumen.  Skin:    General: Skin is warm and dry.  Neurological:     General: No focal deficit present.     Mental Status: He is alert and oriented to person, place, and time.  Psychiatric:        Mood and Affect: Mood normal.        Behavior: Behavior normal.      UC Treatments / Results  Labs (all labs ordered are listed,  but only abnormal results are displayed) Labs Reviewed - No data to display  EKG   Radiology No results found.  Procedures Procedures (including critical care time)  Medications Ordered in UC Medications - No data to display  Initial Impression / Assessment and Plan / UC Course  I have reviewed the triage vital signs and the nursing notes.  Pertinent labs & imaging results that were available during my care of the patient were reviewed by me and considered in my medical decision making (see chart for details).   Jacob Dunlap is a 72 y.o. male presenting with cerumen impaction of both ear canals. Patient is afebrile without recent antipyretics, satting well on room air. Overall is well appearing though non-toxic, well hydrated, without respiratory distress.  Cerumen is present in EACs bilaterally and totally occluding the canal.  Appears deeper in the left EAC than the right.  Earwax removal via irrigation successful.  TMs WNL.  Reviewed chart history.   Counseled patient on potential for adverse effects with medications prescribed/recommended today, ER and return-to-clinic precautions discussed, patient verbalized understanding and agreement with care plan.  Final Clinical  Impressions(s) / UC Diagnoses   Final diagnoses:  None   Discharge Instructions   None    ED Prescriptions   None    PDMP not reviewed this encounter.   Charma Igo, FNP 01/26/23 1005

## 2023-01-27 ENCOUNTER — Ambulatory Visit: Payer: Self-pay

## 2023-07-07 ENCOUNTER — Ambulatory Visit: Payer: Medicare PPO | Admitting: Nurse Practitioner

## 2023-07-07 ENCOUNTER — Encounter: Payer: Self-pay | Admitting: Nurse Practitioner

## 2023-07-07 VITALS — BP 132/86 | HR 68 | Temp 98.1°F | Ht 69.0 in | Wt 177.8 lb

## 2023-07-07 DIAGNOSIS — Z0000 Encounter for general adult medical examination without abnormal findings: Secondary | ICD-10-CM | POA: Insufficient documentation

## 2023-07-07 DIAGNOSIS — Z13220 Encounter for screening for lipoid disorders: Secondary | ICD-10-CM

## 2023-07-07 DIAGNOSIS — Z1159 Encounter for screening for other viral diseases: Secondary | ICD-10-CM

## 2023-07-07 DIAGNOSIS — E663 Overweight: Secondary | ICD-10-CM | POA: Insufficient documentation

## 2023-07-07 DIAGNOSIS — Z125 Encounter for screening for malignant neoplasm of prostate: Secondary | ICD-10-CM | POA: Diagnosis not present

## 2023-07-07 DIAGNOSIS — Z23 Encounter for immunization: Secondary | ICD-10-CM

## 2023-07-07 DIAGNOSIS — H9193 Unspecified hearing loss, bilateral: Secondary | ICD-10-CM | POA: Insufficient documentation

## 2023-07-07 LAB — LIPID PANEL
Cholesterol: 136 mg/dL (ref 0–200)
HDL: 40.3 mg/dL (ref >39.00)
LDL Cholesterol: 80 mg/dL (ref 0–99)
NonHDL: 96.15
Total CHOL/HDL Ratio: 3
Triglycerides: 82 mg/dL (ref 0.0–149.0)
VLDL: 16.4 mg/dL (ref 0.0–40.0)

## 2023-07-07 LAB — CBC
HCT: 43.6 % (ref 39.0–52.0)
Hemoglobin: 14.1 g/dL (ref 13.0–17.0)
MCHC: 32.4 g/dL (ref 30.0–36.0)
MCV: 91.9 fL (ref 78.0–100.0)
Platelets: 211 10*3/uL (ref 150.0–400.0)
RBC: 4.75 Mil/uL (ref 4.22–5.81)
RDW: 13 % (ref 11.5–15.5)
WBC: 5.1 10*3/uL (ref 4.0–10.5)

## 2023-07-07 LAB — COMPREHENSIVE METABOLIC PANEL
ALT: 17 U/L (ref 0–53)
AST: 26 U/L (ref 0–37)
Albumin: 4.3 g/dL (ref 3.5–5.2)
Alkaline Phosphatase: 42 U/L (ref 39–117)
BUN: 23 mg/dL (ref 6–23)
CO2: 29 meq/L (ref 19–32)
Calcium: 9.5 mg/dL (ref 8.4–10.5)
Chloride: 104 meq/L (ref 96–112)
Creatinine, Ser: 0.85 mg/dL (ref 0.40–1.50)
GFR: 86.67 mL/min (ref >60.00)
Glucose, Bld: 88 mg/dL (ref 70–99)
Potassium: 4.7 meq/L (ref 3.5–5.1)
Sodium: 138 meq/L (ref 135–145)
Total Bilirubin: 0.5 mg/dL (ref 0.2–1.2)
Total Protein: 6.8 g/dL (ref 6.0–8.3)

## 2023-07-07 LAB — TSH: TSH: 1.91 u[IU]/mL (ref 0.35–5.50)

## 2023-07-07 LAB — PSA, MEDICARE: PSA: 2.75 ng/mL (ref 0.10–4.00)

## 2023-07-07 NOTE — Assessment & Plan Note (Signed)
Exam is clear of cerumen impaction.  Ambulatory referral to audiology for official hearing evaluation.

## 2023-07-07 NOTE — Assessment & Plan Note (Signed)
Pending TSH and lipid panel.  Patient is decently active continue

## 2023-07-07 NOTE — Assessment & Plan Note (Signed)
Discussed age-appropriate observations and screening exams.  Patient is up-to-date on all age-appropriate vaccinations.  Will update Prevnar 20 today.  Did review patient's personal, surgical, social, family histories.  Patient is up-to-date on CRC screening.  Will do PSA for prostate cancer screening today.  Patient was given information at discharge about preventative healthcare maintenance with anticipatory guidance.

## 2023-07-07 NOTE — Patient Instructions (Signed)
Nice to see you today I will be in touch with the labs once I have reviewed them We did update your pneumonia vaccine today I want to see you in 1 year for your next physical, sooner if you need me

## 2023-07-07 NOTE — Progress Notes (Signed)
New Patient Office Visit  Subjective    Patient ID: Jacob Dunlap, male    DOB: 1951-04-14  Age: 72 y.o. MRN: [id number]  CC:  Chief Complaint  Patient presents with   Establish Care    Pt requests physical and labs     HPI Jacob Dunlap presents to establish care   for complete physical and follow up of chronic conditions.  Immunizations: -Tetanus:  update at the pharmacy  -Influenza: up to date -Shingles: Completed Shingrix series -Pneumonia: unsure needs prenar 20  Diet: Fair diet. States that he will eat 3 meals a day and very little snacking. Coffee, water, tea, and soda (zero sugar) Exercise: No regular exercise. Does yard work   Eye exam: PRN Dental exam: Needs updating     Colonoscopy: Completed in years ago. iFOB within the last couple of months  Lung Cancer Screening: NA  PSA: Due   Sleep: 930-10 and will get up around 5am. Feels rested most timesl he does snore   Outpatient Encounter Medications as of 07/07/2023  Medication Sig   Multiple Vitamin (MULTIVITAMIN) tablet Take 1 tablet by mouth daily.   No facility-administered encounter medications on file as of 07/07/2023.    Past Medical History:  Diagnosis Date   Chronic kidney disease    hematuria, bladder diverticulum, no recent problems    Past Surgical History:  Procedure Laterality Date   INGUINAL HERNIA REPAIR Left 04/22/2019   Procedure: LEFT INGUINAL HERNIA REPAIR WITH MESH;  Surgeon: Griselda Miner, MD;  Location: [facility];  Service: General;  Laterality: Left;   OTHER SURGICAL HISTORY     bladder biopsy     Family History  Problem Relation Age of Onset   Healthy Mother    Heart Problems Father    Cancer Sister        breast    Social History   Socioeconomic History   Marital status: Married    Spouse name: Jacob Dunlap   Number of children: 2   Years of education: Not on file   Highest education level: Not on file  Occupational History   Not on file   Tobacco Use   Smoking status: Never   Smokeless tobacco: Never  Vaping Use   Vaping status: Never Used  Substance and Sexual Activity   Alcohol use: Never   Drug use: No   Sexual activity: Yes  Other Topics Concern   Not on file  Social History Narrative   Part time school bus driver        Jacob Dunlap (43)   Jacob Dunlap (39)   Social Determinants of Health   Financial Resource Strain: Not on file  Food Insecurity: Not on file  Transportation Needs: Not on file  Physical Activity: Not on file  Stress: Not on file  Social Connections: Not on file  Intimate Partner Violence: Not on file    Review of Systems  Constitutional:  Negative for chills and fever.  Respiratory:  Negative for shortness of breath.   Cardiovascular:  Negative for chest pain and leg swelling.  Gastrointestinal:  Negative for abdominal pain, blood in stool, constipation, diarrhea, nausea and vomiting.       Bm daily   Genitourinary:  Negative for dysuria and hematuria.       Nocturia 1-2  Neurological:  Negative for tingling and headaches.  Psychiatric/Behavioral:  Negative for hallucinations and suicidal ideas.         Objective    BP  132/86   Pulse 68   Temp 98.1 F (36.7 C) (Oral)   Ht 5\' 9"  (1.753 m)   Wt 177 lb 12.8 oz (80.6 kg)   SpO2 98%   BMI 26.26 kg/m   Physical Exam Vitals and nursing note reviewed.  Constitutional:      Appearance: Normal appearance.  HENT:     Right Ear: Tympanic membrane, ear canal and external ear normal.     Left Ear: Ear canal and external ear normal. Tympanic membrane is injected.     Mouth/Throat:     Mouth: Mucous membranes are moist.     Pharynx: Oropharynx is clear.  Eyes:     Extraocular Movements: Extraocular movements intact.     Pupils: Pupils are equal, round, and reactive to light.  Cardiovascular:     Rate and Rhythm: Normal rate and regular rhythm.     Pulses: Normal pulses.     Heart sounds: Normal heart sounds.  Pulmonary:     Effort:  Pulmonary effort is normal.     Breath sounds: Normal breath sounds.  Abdominal:     General: Bowel sounds are normal. There is no distension.     Palpations: There is no mass.     Tenderness: There is no abdominal tenderness.     Hernia: No hernia is present.  Musculoskeletal:     Right lower leg: No edema.     Left lower leg: No edema.  Lymphadenopathy:     Cervical: No cervical adenopathy.  Skin:    General: Skin is warm.  Neurological:     General: No focal deficit present.     Mental Status: He is alert.     Deep Tendon Reflexes:     Reflex Scores:      Bicep reflexes are 2+ on the right side and 2+ on the left side.      Patellar reflexes are 2+ on the right side and 2+ on the left side.    Comments: Bilateral upper and lower extremity strength 5/5  Psychiatric:        Mood and Affect: Mood normal.        Behavior: Behavior normal.        Thought Content: Thought content normal.        Judgment: Judgment normal.         Assessment & Plan:   Problem List Items Addressed This Visit       Other   Preventative health care - Primary    Discussed age-appropriate observations and screening exams.  Patient is up-to-date on all age-appropriate vaccinations.  Will update Prevnar 20 today.  Did review patient's personal, surgical, social, family histories.  Patient is up-to-date on CRC screening.  Will do PSA for prostate cancer screening today.  Patient was given information at discharge about preventative healthcare maintenance with anticipatory guidance.      Relevant Orders   CBC   Comprehensive metabolic panel   TSH   Overweight    Pending TSH and lipid panel.  Patient is decently active continue      Decreased hearing of both ears    Exam is clear of cerumen impaction.  Ambulatory referral to audiology for official hearing evaluation.      Relevant Orders   Ambulatory referral to Audiology   Other Visit Diagnoses     Encounter for hepatitis C screening  test for low risk patient       Relevant Orders   Hepatitis C Antibody  Need for pneumococcal 20-valent conjugate vaccination       Relevant Orders   Pneumococcal conjugate vaccine 20-valent (Prevnar 20) (Completed)   Screening for lipid disorders       Relevant Orders   Lipid panel   Screening for prostate cancer       Relevant Orders   PSA, Medicare       Return in about 1 year (around 07/06/2024) for CPE and Labs.   Audria Nine, NP

## 2023-07-08 LAB — HEPATITIS C ANTIBODY: Hepatitis C Ab: NONREACTIVE

## 2023-07-09 ENCOUNTER — Telehealth: Payer: Self-pay | Admitting: Nurse Practitioner

## 2023-07-09 NOTE — Telephone Encounter (Signed)
Pt called in state he was returning a call I inform the patient that Liver, kidney, electrolytes, cholesterol, thyroid , prostate, Hepatitis C, red and white blood cells all look good.

## 2023-08-11 ENCOUNTER — Ambulatory Visit: Payer: Medicare PPO | Attending: Nurse Practitioner | Admitting: Audiologist

## 2023-08-11 DIAGNOSIS — H903 Sensorineural hearing loss, bilateral: Secondary | ICD-10-CM | POA: Diagnosis present

## 2023-08-11 NOTE — Procedures (Signed)
  Outpatient Audiology and [facility] [address] [place], [place]  [address] [phone]  AUDIOLOGICAL  EVALUATION  NAME: Jacob Dunlap     DOB:   12-05-1950      MRN: [id number]                                                                                     DATE: 08/11/2023     REFERENT: Eden Emms, NP STATUS: Outpatient DIAGNOSIS: Sensorineural hearing loss bilateral  History: Wesely was seen for an audiological evaluation due to his wife's concern that he is not hearing things clearly.  He has difficulty hearing his wife when her back is turned or she is in another room.  Otherwise he feels he hears pretty well unless he is in a lot of background noise. Dannel denies pain, pressure, or tinnitus. Lexton has history of hazardous noise exposure from serving in the [employer] and going to loud concerts.  Medical history shows no medical risk for hearing loss.    Evaluation:  Otoscopy showed a clear view of the tympanic membranes, bilaterally Tympanometry results were consistent with normal middle ear function bilaterally Audiometric testing was completed using Conventional Audiometry techniques with insert earphones and supraural headphones. Test results are consistent with normal hearing 250 through 1.5kHz sloping to a severe sensorineural hearing loss bilaterally. Speech Recognition Thresholds were obtained at 25dB HL in the right ear and at 25dB HL in the left ear. Word Recognition Testing was completed at  40dB SL and Joran scored 96% in the right ear and 100% in the left ear.  Results:  The test results were reviewed with Zollie Beckers.  He has a high-frequency sensorineural hearing loss in both ears.  He will miss a few high pitch fricative sounds that are visible on the face, this is why he hears people better when he can see their face.  At this time he still has normal to near normal hearing at 2 kHz.  He is not yet a hearing aid candidate.  Recommend he  start getting hearing test every year or so to monitor the hearing loss for progression. Audiogram printed and provided to [facility].    Recommendations:  Annual audiometric testing recommended to monitor hearing loss for progression.   22 minutes spent testing and counseling on results.   If you have any questions please feel free to contact me at [phone].  Ammie Ferrier Au.D.  Audiologist   08/11/2023  10:41 AM  Cc: Eden Emms, NP

## 2024-03-29 ENCOUNTER — Telehealth: Payer: Self-pay

## 2024-03-29 NOTE — Telephone Encounter (Signed)
 No notes on why Jacob Dunlap needed a call back. Pt states voicemail has no information.   Scheduled pt with his CPE for 07/06/2024

## 2024-03-29 NOTE — Telephone Encounter (Signed)
 Copied from CRM [id number]. Topic: General - Other >> Mar 29, 2024 12:33 PM Donna BRAVO wrote: Reason for CRM: patient returning call from [facility], patient would like to speak with Lindie

## 2024-04-19 LAB — FECAL OCCULT BLOOD, IMMUNOCHEMICAL: IFOBT: NEGATIVE

## 2024-05-27 ENCOUNTER — Encounter: Payer: Self-pay | Admitting: Nurse Practitioner

## 2024-07-06 ENCOUNTER — Encounter: Payer: Self-pay | Admitting: Nurse Practitioner

## 2024-07-06 ENCOUNTER — Ambulatory Visit: Admitting: Nurse Practitioner

## 2024-07-06 VITALS — BP 134/88 | HR 60 | Temp 97.7°F | Ht 64.5 in | Wt 173.6 lb

## 2024-07-06 DIAGNOSIS — E663 Overweight: Secondary | ICD-10-CM | POA: Diagnosis not present

## 2024-07-06 DIAGNOSIS — Z13220 Encounter for screening for lipoid disorders: Secondary | ICD-10-CM

## 2024-07-06 DIAGNOSIS — Z0000 Encounter for general adult medical examination without abnormal findings: Secondary | ICD-10-CM

## 2024-07-06 DIAGNOSIS — Z125 Encounter for screening for malignant neoplasm of prostate: Secondary | ICD-10-CM

## 2024-07-06 LAB — COMPREHENSIVE METABOLIC PANEL WITH GFR
ALT: 17 U/L (ref 0–53)
AST: 24 U/L (ref 0–37)
Albumin: 4.4 g/dL (ref 3.5–5.2)
Alkaline Phosphatase: 45 U/L (ref 39–117)
BUN: 24 mg/dL — ABNORMAL HIGH (ref 6–23)
CO2: 31 meq/L (ref 19–32)
Calcium: 9.4 mg/dL (ref 8.4–10.5)
Chloride: 101 meq/L (ref 96–112)
Creatinine, Ser: 0.87 mg/dL (ref 0.40–1.50)
GFR: 85.46 mL/min (ref >60.00)
Glucose, Bld: 98 mg/dL (ref 70–99)
Potassium: 5.3 meq/L — ABNORMAL HIGH (ref 3.5–5.1)
Sodium: 139 meq/L (ref 135–145)
Total Bilirubin: 0.7 mg/dL (ref 0.2–1.2)
Total Protein: 7.1 g/dL (ref 6.0–8.3)

## 2024-07-06 LAB — CBC WITH DIFFERENTIAL/PLATELET
Basophils Absolute: 0 K/uL (ref 0.0–0.1)
Basophils Relative: 0.2 % (ref 0.0–3.0)
Eosinophils Absolute: 0.1 K/uL (ref 0.0–0.7)
Eosinophils Relative: 1.8 % (ref 0.0–5.0)
HCT: 42.4 % (ref 39.0–52.0)
Hemoglobin: 14.2 g/dL (ref 13.0–17.0)
Lymphocytes Relative: 23.4 % (ref 12.0–46.0)
Lymphs Abs: 1.6 K/uL (ref 0.7–4.0)
MCHC: 33.5 g/dL (ref 30.0–36.0)
MCV: 92 fl (ref 78.0–100.0)
Monocytes Absolute: 0.8 K/uL (ref 0.1–1.0)
Monocytes Relative: 11.8 % (ref 3.0–12.0)
Neutro Abs: 4.2 K/uL (ref 1.4–7.7)
Neutrophils Relative %: 62.8 % (ref 43.0–77.0)
Platelets: 190 K/uL (ref 150.0–400.0)
RBC: 4.61 Mil/uL (ref 4.22–5.81)
RDW: 13.4 % (ref 11.5–15.5)
WBC: 6.8 K/uL (ref 4.0–10.5)

## 2024-07-06 LAB — LIPID PANEL
Cholesterol: 131 mg/dL (ref 0–200)
HDL: 39 mg/dL — ABNORMAL LOW (ref >39.00)
LDL Cholesterol: 79 mg/dL (ref 0–99)
NonHDL: 92.3
Total CHOL/HDL Ratio: 3
Triglycerides: 68 mg/dL (ref 0.0–149.0)
VLDL: 13.6 mg/dL (ref 0.0–40.0)

## 2024-07-06 LAB — PSA, MEDICARE: PSA: 1.66 ng/mL (ref 0.10–4.00)

## 2024-07-06 LAB — TSH: TSH: 1.57 u[IU]/mL (ref 0.35–5.50)

## 2024-07-06 NOTE — Assessment & Plan Note (Signed)
 Pending TSH, lipid panel.  Continue working on healthy lifestyle modifications inclusive of exercise.

## 2024-07-06 NOTE — Assessment & Plan Note (Signed)
 Discussed age-appropriate immunizations and screening exams.  Did review patient's personal, surgical, social, family history.  Patient up-to-date on all age-appropriate vaccinations he would like.  Can get Tdap at local pharmacy.  Patient up-to-date on CRC screening with iFOB done August 2025.  PSA today for prostate cancer screening.  Patient has been information at discharge about preventative healthcare maintenance with anticipatory guidance

## 2024-07-06 NOTE — Progress Notes (Signed)
 Established Patient Office Visit  Subjective   Patient ID: Jacob Dunlap, male    DOB: 03/10/1951  Age: 73 y.o. MRN: [id number]  Chief Complaint  Patient presents with   Annual Exam    HPI   for complete physical and follow up of chronic conditions.  Immunizations: -Tetanus: Completed in unsure.  Can get at local pharmacy -Influenza: updated 1 month ago at [facility]  -Shingles: Completed Shingrix series -Pneumonia: Completed 2024  Diet: Fair diet. 3 meals a day that are mostly balanced. Does not snack much. He will do coffee and sodas (zero sugar versions) and some water   Exercise: No regular exercise. he use to do walking and regular   Eye exam: PRN. Wears reader   Dental exam: Completes annually. Dr. Shlomo    Colonoscopy: 04/19/2024 with negative fecal occult Lung Cancer Screening: N/A  PSA: Due  Advance directive: he does have one.      Review of Systems  Constitutional:  Negative for chills and fever.  Respiratory:  Negative for shortness of breath.   Cardiovascular:  Negative for chest pain and leg swelling.  Gastrointestinal:  Negative for abdominal pain, blood in stool, constipation, diarrhea, nausea and vomiting.       Bm daily   Genitourinary:  Negative for dysuria and hematuria.  Neurological:  Negative for dizziness, tingling and headaches.  Psychiatric/Behavioral:  Negative for hallucinations and suicidal ideas.       Objective:     BP 134/88   Pulse 60   Temp 97.7 F (36.5 C) (Oral)   Ht 5' 4.5 (1.638 m)   Wt 173 lb 9.6 oz (78.7 kg)   SpO2 96%   BMI 29.34 kg/m  BP Readings from Last 3 Encounters:  07/06/24 134/88  07/07/23 132/86  01/26/23 139/88   Wt Readings from Last 3 Encounters:  07/06/24 173 lb 9.6 oz (78.7 kg)  07/07/23 177 lb 12.8 oz (80.6 kg)  04/22/19 169 lb 8.5 oz (76.9 kg)   SpO2 Readings from Last 3 Encounters:  07/06/24 96%  07/07/23 98%  01/26/23 92%      Physical Exam Vitals and nursing note  reviewed.  Constitutional:      Appearance: Normal appearance.  HENT:     Right Ear: Tympanic membrane, ear canal and external ear normal.     Left Ear: Tympanic membrane, ear canal and external ear normal.     Mouth/Throat:     Mouth: Mucous membranes are moist.     Pharynx: Oropharynx is clear.  Eyes:     Extraocular Movements: Extraocular movements intact.     Pupils: Pupils are equal, round, and reactive to light.  Cardiovascular:     Rate and Rhythm: Normal rate and regular rhythm.     Pulses: Normal pulses.     Heart sounds: Normal heart sounds.  Pulmonary:     Effort: Pulmonary effort is normal.     Breath sounds: Normal breath sounds.  Abdominal:     General: Bowel sounds are normal. There is no distension.     Palpations: There is no mass.     Tenderness: There is no abdominal tenderness.     Hernia: No hernia is present.  Musculoskeletal:     Right lower leg: No edema.     Left lower leg: No edema.  Lymphadenopathy:     Cervical: No cervical adenopathy.  Skin:    General: Skin is warm.  Neurological:     General: No focal deficit present.  Mental Status: He is alert.     Deep Tendon Reflexes:     Reflex Scores:      Bicep reflexes are 2+ on the right side and 2+ on the left side.      Patellar reflexes are 2+ on the right side and 2+ on the left side.    Comments: Bilateral upper and lower extremity strength 5/5  Psychiatric:        Mood and Affect: Mood normal.        Behavior: Behavior normal.        Thought Content: Thought content normal.        Judgment: Judgment normal.      No results found for any visits on 07/06/24.    The 10-year ASCVD risk score (Arnett DK, et al., 2019) is: 22%    Assessment & Plan:   Problem List Items Addressed This Visit       Other   Preventative health care - Primary   Discussed age-appropriate immunizations and screening exams.  Did review patient's personal, surgical, social, family history.  Patient  up-to-date on all age-appropriate vaccinations he would like.  Can get Tdap at local pharmacy.  Patient up-to-date on CRC screening with iFOB done August 2025.  PSA today for prostate cancer screening.  Patient has been information at discharge about preventative healthcare maintenance with anticipatory guidance      Relevant Orders   CBC with Differential/Platelet   Comprehensive metabolic panel with GFR   TSH   Overweight   Pending TSH, lipid panel.  Continue working on healthy lifestyle modifications inclusive of exercise.      Relevant Orders   Lipid panel   Other Visit Diagnoses       Screening for lipid disorders       Relevant Orders   Lipid panel     Screening for prostate cancer       Relevant Orders   PSA, Medicare       Return in about 1 year (around 07/06/2025) for CPE and Labs.    Adina Crandall, NP

## 2024-07-06 NOTE — Patient Instructions (Signed)
 Nice to see you today I will be in touch with the labs once I have them Follow up with me in 1 year, sooner if you need me

## 2024-07-07 ENCOUNTER — Ambulatory Visit: Payer: Self-pay | Admitting: Nurse Practitioner

## 2024-07-07 DIAGNOSIS — E875 Hyperkalemia: Secondary | ICD-10-CM

## 2024-07-11 ENCOUNTER — Telehealth: Payer: Self-pay

## 2024-07-11 NOTE — Telephone Encounter (Signed)
 Copied from CRM [id number]. Topic: Clinical - Request for Lab/Test Order >> Jul 11, 2024  8:41 AM Cleave MATSU wrote: Reason for CRM: pt needs lab appt scheduled Dr. Jerel him over a message and stated that he would like for pt to repeat labs. Please schedule this and call pt back to let him know when his appt will be

## 2024-07-19 ENCOUNTER — Other Ambulatory Visit (INDEPENDENT_AMBULATORY_CARE_PROVIDER_SITE_OTHER)

## 2024-07-19 DIAGNOSIS — E875 Hyperkalemia: Secondary | ICD-10-CM | POA: Diagnosis not present

## 2024-07-19 LAB — POTASSIUM: Potassium: 4.4 meq/L (ref 3.5–5.1)

## 2024-07-20 ENCOUNTER — Ambulatory Visit: Payer: Self-pay | Admitting: Nurse Practitioner

## 2024-08-30 ENCOUNTER — Telehealth: Payer: Self-pay | Admitting: Nurse Practitioner

## 2024-08-30 ENCOUNTER — Ambulatory Visit: Payer: Self-pay

## 2024-08-30 ENCOUNTER — Telehealth: Payer: Self-pay

## 2024-08-30 DIAGNOSIS — Z20828 Contact with and (suspected) exposure to other viral communicable diseases: Secondary | ICD-10-CM

## 2024-08-30 MED ORDER — OSELTAMIVIR PHOSPHATE 75 MG PO CAPS: 75.0000 mg | ORAL_CAPSULE | Freq: Every day | ORAL | 0 refills | Status: AC

## 2024-08-30 NOTE — Addendum Note (Signed)
 Addended by: Nyja Westbrook K on: 08/30/2024 01:59 PM   Modules accepted: Orders

## 2024-08-30 NOTE — Telephone Encounter (Signed)
 Copied from CRM #[id number]. Topic: General - Call Back - No Documentation >> Aug 30, 2024 10:32 AM Jacob Dunlap wrote: Reason for CRM: Patient called back to check and see if Tamiflu  would be sent to the pharmacy for him as a preventative. I did advise patient that CMA Harlene did route the note over and we are awaiting a response. Patient would like a call on whether the tamiflu  can be sent or not.   [phone] Jacob Dunlap)

## 2024-08-30 NOTE — Telephone Encounter (Signed)
 Contacted pt and informed him of script sent in. No further questions or concerns.

## 2024-08-30 NOTE — Telephone Encounter (Signed)
 Looks like this has already been handled by Mallie Gaskins

## 2024-08-30 NOTE — Telephone Encounter (Signed)
 He is not having symptoms currently.    08/30/2024  LOV: 07/06/2024  What is the name of the medication or equipment? Tamaflu  Have you contacted your pharmacy to request a refill? Yes   Which pharmacy would you like this sent to?  [id number] [address], [place] - [address] AT [facility] [address] [place] [address] Phone: [phone] Fax: [phone]    Patient notified that their request is being sent to the clinical staff for review and that they should receive a response within 2 business days.   Please advise at Mobile [phone] (mobile)

## 2024-08-30 NOTE — Telephone Encounter (Signed)
 Yes, we can provide a preventative dose of Tamiflu .  Does he have any symptoms?  If so what are they?

## 2024-08-30 NOTE — Telephone Encounter (Signed)
 Noted.  He will take 1 capsule by mouth once daily for 10 days.  Prescription sent to pharmacy.

## 2024-08-30 NOTE — Telephone Encounter (Signed)
 Duplicate

## 2024-08-30 NOTE — Telephone Encounter (Signed)
 Pt requesting Tamiflu  due to exposure

## 2024-08-30 NOTE — Telephone Encounter (Unsigned)
 Copied from CRM [id number]. Topic: Clinical - Medical Advice >> Aug 29, 2024  2:24 PM Alexandria E wrote: Reason for CRM: Patient's mother tested positive for the flu and he has been around her. Patient questioning if he can get a prescription of Tamiflu  called in, patient is currently asymptomatic. Patient uses the [facility] on [address].
